# Patient Record
Sex: Female | Born: 1935
Health system: Southern US, Community
[De-identification: ages and names within clinical notes are randomized; demographics above are authoritative.]

## PROBLEM LIST (undated history)

## (undated) DIAGNOSIS — K279 Peptic ulcer, site unspecified, unspecified as acute or chronic, without hemorrhage or perforation: Secondary | ICD-10-CM

## (undated) DIAGNOSIS — I272 Pulmonary hypertension, unspecified: Secondary | ICD-10-CM

## (undated) DIAGNOSIS — F329 Major depressive disorder, single episode, unspecified: Secondary | ICD-10-CM

## (undated) DIAGNOSIS — E039 Hypothyroidism, unspecified: Secondary | ICD-10-CM

## (undated) DIAGNOSIS — D649 Anemia, unspecified: Secondary | ICD-10-CM

## (undated) DIAGNOSIS — I482 Chronic atrial fibrillation, unspecified: Secondary | ICD-10-CM

## (undated) DIAGNOSIS — M1990 Unspecified osteoarthritis, unspecified site: Secondary | ICD-10-CM

## (undated) DIAGNOSIS — S065X9A Traumatic subdural hemorrhage with loss of consciousness of unspecified duration, initial encounter: Secondary | ICD-10-CM

## (undated) DIAGNOSIS — F32A Depression, unspecified: Secondary | ICD-10-CM

## (undated) DIAGNOSIS — R296 Repeated falls: Secondary | ICD-10-CM

## (undated) DIAGNOSIS — I5032 Chronic diastolic (congestive) heart failure: Secondary | ICD-10-CM

## (undated) DIAGNOSIS — F419 Anxiety disorder, unspecified: Secondary | ICD-10-CM

## (undated) DIAGNOSIS — N184 Chronic kidney disease, stage 4 (severe): Secondary | ICD-10-CM

## (undated) DIAGNOSIS — I10 Essential (primary) hypertension: Secondary | ICD-10-CM

## (undated) DIAGNOSIS — W19XXXA Unspecified fall, initial encounter: Secondary | ICD-10-CM

## (undated) DIAGNOSIS — R9431 Abnormal electrocardiogram [ECG] [EKG]: Secondary | ICD-10-CM

## (undated) DIAGNOSIS — M10372 Gout due to renal impairment, left ankle and foot: Secondary | ICD-10-CM

## (undated) DIAGNOSIS — S065XAA Traumatic subdural hemorrhage with loss of consciousness status unknown, initial encounter: Secondary | ICD-10-CM

## (undated) HISTORY — PX: TUBAL LIGATION: SHX77

## (undated) HISTORY — PX: ABDOMINAL HYSTERECTOMY: SUR658

---

## 1997-12-04 ENCOUNTER — Encounter: Payer: Self-pay | Admitting: Emergency Medicine

## 1997-12-04 ENCOUNTER — Emergency Department (HOSPITAL_COMMUNITY): Admission: EM | Admit: 1997-12-04 | Discharge: 1997-12-04 | Payer: Self-pay | Admitting: Emergency Medicine

## 2004-06-14 ENCOUNTER — Encounter: Admission: RE | Admit: 2004-06-14 | Discharge: 2004-06-14 | Payer: Self-pay | Admitting: Specialist

## 2005-10-17 ENCOUNTER — Emergency Department (HOSPITAL_COMMUNITY): Admission: EM | Admit: 2005-10-17 | Discharge: 2005-10-18 | Payer: Self-pay | Admitting: Emergency Medicine

## 2005-10-25 ENCOUNTER — Encounter: Admission: RE | Admit: 2005-10-25 | Discharge: 2005-10-25 | Payer: Self-pay | Admitting: Specialist

## 2007-06-11 ENCOUNTER — Inpatient Hospital Stay (HOSPITAL_COMMUNITY): Admission: EM | Admit: 2007-06-11 | Discharge: 2007-06-12 | Payer: Self-pay | Admitting: Emergency Medicine

## 2007-07-31 ENCOUNTER — Ambulatory Visit (HOSPITAL_COMMUNITY): Admission: RE | Admit: 2007-07-31 | Discharge: 2007-07-31 | Payer: Self-pay | Admitting: Neurosurgery

## 2008-01-30 ENCOUNTER — Ambulatory Visit (HOSPITAL_COMMUNITY): Admission: RE | Admit: 2008-01-30 | Discharge: 2008-01-30 | Payer: Self-pay | Admitting: Neurosurgery

## 2010-05-10 ENCOUNTER — Encounter: Payer: Self-pay | Admitting: Neurosurgery

## 2010-09-01 NOTE — Discharge Summary (Signed)
Taylor Coleman, Taylor Coleman                ACCOUNT NO.:  [id number]   MEDICAL RECORD NO.:  [id number]          PATIENT TYPE:  INP   LOCATION:  3106                         FACILITY:  [facility]   PHYSICIAN:  Clydene Fake, M.D.  DATE OF BIRTH:  01-08-1929   DATE OF ADMISSION:  06/10/2007  DATE OF DISCHARGE:  06/12/2007                               DISCHARGE SUMMARY   DIAGNOSES:  Closed head injury, headache, mass extra axial front of her  brain stem.   DISCHARGE DIAGNOSES:  Closed head injury, headache, mass extra axial  front of her brain stem.   REASON FOR ADMISSION:  The patient is a 77 year old woman who fell down  the last stairs on a flight of stairs hitting the back of her head  having a contusion there on the skull.  She had no loss of  consciousness, no nausea or vomiting, had a growing knot in the right  occiput and presented to the emergency room.  A CT and MR were done  showing a hyperdense mass in the front of the brain stem at the  vertebrobasilar junction.  MRI showed it was hyperintense at T1,  hypointense in T2.  MRA was negative.  There was thought to be the  possibility of a blood clot versus a benign mass, something like a  dermoid.  The patient was watched in the ICU for 2 nights, most of the  night on Saturday and then overnight Sunday.  She has remained  neurologically intact.  Her headache has dissipated.  She has been  eating well, up ambulating, no major complaints.  Blood pressure has  been controlled.  A repeat CT shows lesion is unchanged and no  enhancement with contrast and CT angiogram shows no aneurysm, no problem  with the blood vessels in the vertebrobasilar area or anywhere else.  The patient will be discharged home in stable condition.   DISCHARGE MEDICATIONS:  Same as pre-hospitalization.   FOLLOWUP:  MRI with and without contrast in 3-4 weeks from now with  followup in my office in 4-5 weeks from now to evaluate this further.     ______________________________  Clydene Fake, M.D.     JRH/MEDQ  D:  06/12/2007  T:  06/12/2007  Job:  [id number]

## 2011-01-11 LAB — I-STAT 8, (EC8 V) (CONVERTED LAB)
BUN: 15
Bicarbonate: 23.9
Glucose, Bld: 144 — ABNORMAL HIGH
Hemoglobin: 15
Sodium: 137
TCO2: 25
pH, Ven: 7.386 — ABNORMAL HIGH

## 2011-01-11 LAB — CBC
HCT: 39.7
Hemoglobin: 13.6
MCV: 90.1
RDW: 13.2
WBC: 7.7

## 2011-01-11 LAB — BASIC METABOLIC PANEL
BUN: 13
Chloride: 100
GFR calc non Af Amer: >60
Glucose, Bld: 130 — ABNORMAL HIGH
Potassium: 4.5
Sodium: 137

## 2011-01-11 LAB — APTT: aPTT: 34

## 2011-01-11 LAB — POCT I-STAT CREATININE: Operator id: 265201

## 2011-01-18 LAB — BUN: BUN: 19

## 2011-01-18 LAB — CREATININE, SERUM
Creatinine, Ser: 1.07
GFR calc non Af Amer: 50 — ABNORMAL LOW

## 2011-04-04 ENCOUNTER — Inpatient Hospital Stay (HOSPITAL_COMMUNITY)
Admission: EM | Admit: 2011-04-04 | Discharge: 2011-04-09 | DRG: 086 | Disposition: A | Discharge status: Home / Self Care | Payer: Medicare Other | Attending: Neurosurgery | Admitting: Neurosurgery

## 2011-04-04 ENCOUNTER — Emergency Department (HOSPITAL_COMMUNITY): Payer: Medicare Other

## 2011-04-04 ENCOUNTER — Other Ambulatory Visit: Payer: Self-pay

## 2011-04-04 DIAGNOSIS — S0100XA Unspecified open wound of scalp, initial encounter: Secondary | ICD-10-CM | POA: Diagnosis present

## 2011-04-04 DIAGNOSIS — W19XXXA Unspecified fall, initial encounter: Secondary | ICD-10-CM

## 2011-04-04 DIAGNOSIS — Y998 Other external cause status: Secondary | ICD-10-CM

## 2011-04-04 DIAGNOSIS — S0101XA Laceration without foreign body of scalp, initial encounter: Secondary | ICD-10-CM

## 2011-04-04 DIAGNOSIS — W108XXA Fall (on) (from) other stairs and steps, initial encounter: Secondary | ICD-10-CM | POA: Diagnosis present

## 2011-04-04 DIAGNOSIS — A498 Other bacterial infections of unspecified site: Secondary | ICD-10-CM | POA: Diagnosis present

## 2011-04-04 DIAGNOSIS — N390 Urinary tract infection, site not specified: Secondary | ICD-10-CM

## 2011-04-04 DIAGNOSIS — Y9229 Other specified public building as the place of occurrence of the external cause: Secondary | ICD-10-CM

## 2011-04-04 DIAGNOSIS — B9620 Unspecified Escherichia coli [E. coli] as the cause of diseases classified elsewhere: Secondary | ICD-10-CM | POA: Diagnosis present

## 2011-04-04 DIAGNOSIS — S065X0A Traumatic subdural hemorrhage without loss of consciousness, initial encounter: Principal | ICD-10-CM | POA: Diagnosis present

## 2011-04-04 DIAGNOSIS — I10 Essential (primary) hypertension: Secondary | ICD-10-CM | POA: Diagnosis present

## 2011-04-04 DIAGNOSIS — S065XAA Traumatic subdural hemorrhage with loss of consciousness status unknown, initial encounter: Secondary | ICD-10-CM | POA: Diagnosis present

## 2011-04-04 DIAGNOSIS — S065X9A Traumatic subdural hemorrhage with loss of consciousness of unspecified duration, initial encounter: Secondary | ICD-10-CM

## 2011-04-04 HISTORY — DX: Essential (primary) hypertension: I10

## 2011-04-04 LAB — CARDIAC PANEL(CRET KIN+CKTOT+MB+TROPI)
CK, MB: 3.5 ng/mL (ref 0.3–4.0)
Relative Index: 2.6 — ABNORMAL HIGH (ref 0.0–2.5)
Troponin I: <0.3 ng/mL (ref <0.30)

## 2011-04-04 LAB — CBC
HCT: 39.9 % (ref 36.0–46.0)
MCH: 30.8 pg (ref 26.0–34.0)
MCV: 90.3 fL (ref 78.0–100.0)
Platelets: 112 10*3/uL — ABNORMAL LOW (ref 150–400)
RDW: 13 % (ref 11.5–15.5)
WBC: 7.1 10*3/uL (ref 4.0–10.5)

## 2011-04-04 LAB — PROTIME-INR: INR: 1.1 (ref 0.00–1.49)

## 2011-04-04 LAB — DIFFERENTIAL
Basophils Absolute: 0 10*3/uL (ref 0.0–0.1)
Eosinophils Absolute: 0.1 10*3/uL (ref 0.0–0.7)
Eosinophils Relative: 1 % (ref 0–5)
Lymphocytes Relative: 29 % (ref 12–46)
Lymphs Abs: 2.1 10*3/uL (ref 0.7–4.0)
Monocytes Absolute: 0.5 10*3/uL (ref 0.1–1.0)

## 2011-04-04 LAB — COMPREHENSIVE METABOLIC PANEL
CO2: 26 mEq/L (ref 19–32)
Calcium: 10 mg/dL (ref 8.4–10.5)
Creatinine, Ser: 1.58 mg/dL — ABNORMAL HIGH (ref 0.50–1.10)
GFR calc Af Amer: 35 mL/min — ABNORMAL LOW (ref >90)
GFR calc non Af Amer: 30 mL/min — ABNORMAL LOW (ref >90)
Glucose, Bld: 149 mg/dL — ABNORMAL HIGH (ref 70–99)
Sodium: 139 mEq/L (ref 135–145)
Total Protein: 7.3 g/dL (ref 6.0–8.3)

## 2011-04-04 LAB — URINE MICROSCOPIC-ADD ON

## 2011-04-04 LAB — URINALYSIS, ROUTINE W REFLEX MICROSCOPIC
Bilirubin Urine: NEGATIVE
Ketones, ur: NEGATIVE mg/dL
Nitrite: POSITIVE — AB
Protein, ur: NEGATIVE mg/dL
Urobilinogen, UA: 0.2 mg/dL (ref 0.0–1.0)

## 2011-04-04 MED ORDER — TRAMADOL HCL 50 MG PO TABS
50.0000 mg | ORAL_TABLET | Freq: Four times a day (QID) | ORAL | Status: DC | PRN
  Filled 2011-04-04: qty 2

## 2011-04-04 MED ORDER — PANTOPRAZOLE SODIUM 40 MG IV SOLR
40.0000 mg | Freq: Every day | INTRAVENOUS | Status: DC
  Administered 2011-04-04 – 2011-04-05 (×2): 40 mg via INTRAVENOUS
  Filled 2011-04-04 (×3): qty 40

## 2011-04-04 MED ORDER — TETANUS-DIPHTHERIA TOXOIDS TD 5-2 LFU IM INJ
0.5000 mL | INJECTION | Freq: Once | INTRAMUSCULAR | Status: AC
  Administered 2011-04-04: 0.5 mL via INTRAMUSCULAR
  Filled 2011-04-04: qty 0.5

## 2011-04-04 MED ORDER — LIDOCAINE HCL (PF) 1 % IJ SOLN
5.0000 mL | Freq: Once | INTRAMUSCULAR | Status: AC
  Administered 2011-04-04: 5 mL

## 2011-04-04 MED ORDER — FAMOTIDINE 20 MG PO TABS
20.0000 mg | ORAL_TABLET | Freq: Every day | ORAL | Status: DC
  Administered 2011-04-05 – 2011-04-09 (×5): 20 mg via ORAL
  Filled 2011-04-04 (×5): qty 1

## 2011-04-04 MED ORDER — ENALAPRIL MALEATE 20 MG PO TABS
20.0000 mg | ORAL_TABLET | Freq: Every day | ORAL | Status: DC
  Administered 2011-04-05 – 2011-04-09 (×5): 20 mg via ORAL
  Filled 2011-04-04 (×5): qty 1

## 2011-04-04 MED ORDER — NITROGLYCERIN 0.4 MG SL SUBL: 0.4000 mg | SUBLINGUAL_TABLET | SUBLINGUAL | Status: DC | PRN

## 2011-04-04 MED ORDER — SODIUM CHLORIDE 0.9 % IV SOLN
INTRAVENOUS | Status: DC
  Administered 2011-04-04: 19:00:00 via INTRAVENOUS

## 2011-04-04 MED ORDER — LEVOFLOXACIN IN D5W 750 MG/150ML IV SOLN
750.0000 mg | INTRAVENOUS | Status: AC
  Administered 2011-04-04: 750 mg via INTRAVENOUS
  Filled 2011-04-04: qty 150

## 2011-04-04 MED ORDER — LEVOFLOXACIN IN D5W 750 MG/150ML IV SOLN: 750.0000 mg | INTRAVENOUS | Status: DC

## 2011-04-04 MED ORDER — ACETAMINOPHEN 325 MG PO TABS
650.0000 mg | ORAL_TABLET | ORAL | Status: DC | PRN
  Filled 2011-04-04: qty 2

## 2011-04-04 MED ORDER — HYDROMORPHONE HCL PF 1 MG/ML IJ SOLN
0.5000 mg | INTRAMUSCULAR | Status: DC | PRN
  Administered 2011-04-04 – 2011-04-05 (×4): 0.5 mg via INTRAVENOUS
  Filled 2011-04-04 (×3): qty 1

## 2011-04-04 MED ORDER — FUROSEMIDE 40 MG PO TABS
40.0000 mg | ORAL_TABLET | Freq: Two times a day (BID) | ORAL | Status: DC
  Administered 2011-04-05 – 2011-04-09 (×8): 40 mg via ORAL
  Filled 2011-04-04 (×12): qty 1

## 2011-04-04 MED ORDER — SENNOSIDES-DOCUSATE SODIUM 8.6-50 MG PO TABS
1.0000 | ORAL_TABLET | Freq: Two times a day (BID) | ORAL | Status: DC
  Administered 2011-04-04 – 2011-04-09 (×9): 1 via ORAL
  Filled 2011-04-04 (×10): qty 1

## 2011-04-04 MED ORDER — ACETAMINOPHEN 650 MG RE SUPP
650.0000 mg | RECTAL | Status: DC | PRN
  Administered 2011-04-07: 650 mg via RECTAL
  Filled 2011-04-04: qty 1

## 2011-04-04 MED ORDER — LABETALOL HCL 5 MG/ML IV SOLN
10.0000 mg | INTRAVENOUS | Status: DC | PRN
  Filled 2011-04-04: qty 8
  Filled 2011-04-04: qty 4

## 2011-04-04 MED ORDER — ONDANSETRON HCL 4 MG/2ML IJ SOLN
4.0000 mg | Freq: Four times a day (QID) | INTRAMUSCULAR | Status: DC | PRN
  Administered 2011-04-04 – 2011-04-05 (×2): 4 mg via INTRAVENOUS
  Filled 2011-04-04 (×2): qty 2

## 2011-04-04 MED ORDER — METOPROLOL TARTRATE 50 MG PO TABS
50.0000 mg | ORAL_TABLET | Freq: Two times a day (BID) | ORAL | Status: DC
  Administered 2011-04-05 – 2011-04-09 (×9): 50 mg via ORAL
  Filled 2011-04-04 (×11): qty 1

## 2011-04-04 NOTE — ED Provider Notes (Signed)
History     CSN: [id number] Arrival date & time: 04/04/2011  3:57 PM   First MD Initiated Contact with Patient 04/04/11 1604      Chief Complaint  Patient presents with  . Fall  . Head Injury  . Head Laceration    HPI  82yoF h/o HTN presents after fall. Pt states "I don't really know why I fell". Family thinks she lost her balance. Fell backward onto escalator. No LOC per family. Pt states "Im not sure what happened". Fully immobilized pta. Pt c/o headache. Bleeding from scalp laceration per family. Denies headache, dizziness, cp, palpitations, shortness of breath pre fall and currently. No neck pain or back pain. Denies hip pain. Has not attempted ambulation since event. Unsure if she's taking anticoagulants.   ED Notes, ED Provider Notes from 04/04/11 0000 to 04/04/11 16:01:07       Katie Army Melia, RN 04/04/2011 15:58      [place] escalator when she lost her balance and fell. She fell backwards hitting her head. She has a laceration to the right posterior surface of her head. Bleeding controlled at the scene. Pt is alert and oriented. Pt reports no loc. Pt fully immobilized pta.     Past Medical History  Diagnosis Date  . Hypertension     Past Surgical History  Procedure Date  . Tubal ligation     History reviewed. No pertinent family history.  History  Substance Use Topics  . Smoking status: Former Games developer  . Smokeless tobacco: Current User  . Alcohol Use: No    OB History    Grav Para Term Preterm Abortions TAB SAB Ect Mult Living                  Review of Systems  All other systems reviewed and are negative.   except as noted HPI  Allergies  Penicillins  Home Medications   Current Outpatient Rx  Name Route Sig Dispense Refill  . ENALAPRIL MALEATE 20 MG PO TABS Oral Take 20 mg by mouth daily.      . FUROSEMIDE 40 MG PO TABS Oral Take 40 mg by mouth 2 (two) times daily.      Marland Kitchen METOPROLOL TARTRATE 50 MG PO TABS Oral  Take 50 mg by mouth 2 (two) times daily.      Marland Kitchen NITROGLYCERIN 0.4 MG SL SUBL Sublingual Place 0.4 mg under the tongue every 5 (five) minutes as needed. For chest pains     . RANITIDINE HCL 150 MG PO TABS Oral Take 150 mg by mouth 2 (two) times daily.        BP 157/69  Pulse 83  Temp(Src) 98 F (36.7 C) (Oral)  Resp 12  SpO2 99%  Physical Exam  Nursing note and vitals reviewed. Constitutional: She is oriented to person, place, and time. She appears well-developed.  HENT:  Mouth/Throat: Oropharynx is clear and moist.       No septal hematoma No facial ttp  Eyes: Conjunctivae and EOM are normal. Pupils are equal, round, and reactive to light.  Neck: Normal range of motion. Neck supple.  Cardiovascular: Normal rate, normal heart sounds and intact distal pulses.   Pulmonary/Chest: Effort normal and breath sounds normal. No respiratory distress. She has no wheezes. She has no rales.  Abdominal: Soft. She exhibits no distension. There is tenderness. There is no rebound and no guarding.       Mild upper abd ttp  No r/g No pulsatile mass  Musculoskeletal: Normal range of motion.       No pain in hips with int/ext rotation b/l LE  Neurological: She is alert and oriented to person, place, and time. No cranial nerve deficit. She exhibits abnormal muscle tone. Coordination normal.  Skin: Skin is warm and dry. No rash noted.       Posterior scalp 3cm bleeding laceration  Psychiatric: She has a normal mood and affect.     Date: 04/04/2011  Rate: 81  Rhythm: atrial fibrillation  QRS Axis: left  Intervals: indeterm  ST/T Wave abnormalities: normal  Conduction Disutrbances:none  Narrative Interpretation: afib, probably inferiro infarct, age indet, anterior infarct old  Old EKG Reviewed: none available   ED Course  LACERATION REPAIR Date/Time: 04/04/2011 4:28 PM Performed by: Forbes Cellar Authorized by: Forbes Cellar Consent: Verbal consent obtained. Consent given by:  patient Patient understanding: patient states understanding of the procedure being performed Patient consent: the patient's understanding of the procedure matches consent given Patient identity confirmed: verbally with patient Time out: Immediately prior to procedure a "time out" was called to verify the correct patient, procedure, equipment, support staff and site/side marked as required. Body area: head/neck Location details: scalp Laceration length: 2 cm Foreign bodies: no foreign bodies Tendon involvement: none Nerve involvement: none Vascular damage: no Anesthesia: local infiltration Local anesthetic: lidocaine 1% without epinephrine Anesthetic total: 2 ml Patient sedated: no Irrigation solution: saline Amount of cleaning: standard Debridement: none Degree of undermining: none Skin closure: staples Number of sutures: 4 Dressing: pressure dressing Patient tolerance: Patient tolerated the procedure well with no immediate complications.   (including critical care time)  Labs Reviewed  CBC - Abnormal; Notable for the following:    Platelets 112 (*) PLATELET COUNT CONFIRMED BY SMEAR   All other components within normal limits  COMPREHENSIVE METABOLIC PANEL - Abnormal; Notable for the following:    Glucose, Bld 149 (*)    BUN 28 (*)    Creatinine, Ser 1.58 (*)    GFR calc non Af Amer 30 (*)    GFR calc Af Amer 35 (*)    All other components within normal limits  CARDIAC PANEL(CRET KIN+CKTOT+MB+TROPI) - Abnormal; Notable for the following:    Relative Index 2.6 (*)    All other components within normal limits  URINALYSIS, ROUTINE W REFLEX MICROSCOPIC - Abnormal; Notable for the following:    Hgb urine dipstick TRACE (*)    Nitrite POSITIVE (*)    Leukocytes, UA TRACE (*)    All other components within normal limits  URINE MICROSCOPIC-ADD ON - Abnormal; Notable for the following:    Bacteria, UA MANY (*)    All other components within normal limits  DIFFERENTIAL   PROTIME-INR  URINE CULTURE   Dg Chest 1 View  04/04/2011  *RADIOLOGY REPORT*  Clinical Data: Fall  CHEST - 1 VIEW  Comparison: None.  Findings: Lungs are essentially clear. No pleural effusion or pneumothorax.  The heart is top normal in size.  IMPRESSION: No evidence of acute cardiopulmonary disease.  Original Report Authenticated By: Charline Bills, M.D.   Dg Thoracic Spine 2 View  04/04/2011  *RADIOLOGY REPORT*  Clinical Data: Fall.  THORACIC SPINE - 2 VIEW  Comparison: None  Findings:  Bones are diffusely osteopenic.  There is no evidence of lumbar spine fracture.  Alignment is normal.  Intervertebral disc spaces are maintained.  IMPRESSION: 1.  Diminished detail due to diffuse osteopenia. 2.  No acute findings.  Original Report Authenticated By: Rosealee Albee, M.D.   Ct Head Wo Contrast  04/04/2011  *RADIOLOGY REPORT*  Clinical Data:  Fall, head laceration  CT HEAD WITHOUT CONTRAST CT CERVICAL SPINE WITHOUT CONTRAST  Technique:  Multidetector CT imaging of the head and cervical spine was performed following the standard protocol without intravenous contrast.  Multiplanar CT image reconstructions of the cervical spine were also generated.  Comparison:  None  CT HEAD  Findings: Left frontal subdural hematoma measuring 6 mm in maximal thickness (series 2/image 20). No evidence of parenchymal hemorrhage.  No mass effect, mass lesion, or midline shift.  No CT evidence of acute infarction.  Extensive subcortical white matter and periventricular small vessel ischemic changes.  Intracranial atherosclerosis.  Age related atrophy.  No ventriculomegaly.  The visualized paranasal sinuses are essentially clear. The mastoid air cells are unopacified.  Soft tissue swelling/laceration overlying the right parietal scalp.  No evidence of calvarial fracture.  IMPRESSION: Left frontal subdural hematoma measuring 6 mm in maximal thickness. No midline shift.  Soft tissue swelling/laceration overlying the right  parietal scalp. No evidence of calvarial fracture.  Atrophy with small vessel ischemic changes and intracranial atherosclerosis.  Critical Value/emergent results were called by telephone at the time of interpretation on 04/04/2011  at 1720 hrs  to  Dr Hyman Hopes in the ED, who verbally acknowledged these results.  CT CERVICAL SPINE  Findings: Normal cervical lordosis.  No evidence of fracture dislocation.  Vertebral body heights are maintained.  The dens appears intact.  No prevertebral soft tissue swelling.  Moderate multilevel degenerative changes.  Partially calcified pannus at C1-2.  Visualized thyroid is unremarkable.  Visualized lung apices are grossly clear.  IMPRESSION: No evidence of traumatic injury to the cervical spine.  Moderate multilevel degenerative changes.  Original Report Authenticated By: Charline Bills, M.D.   Ct Cervical Spine Wo Contrast  04/04/2011  *RADIOLOGY REPORT*  Clinical Data:  Fall, head laceration  CT HEAD WITHOUT CONTRAST CT CERVICAL SPINE WITHOUT CONTRAST  Technique:  Multidetector CT imaging of the head and cervical spine was performed following the standard protocol without intravenous contrast.  Multiplanar CT image reconstructions of the cervical spine were also generated.  Comparison:  None  CT HEAD  Findings: Left frontal subdural hematoma measuring 6 mm in maximal thickness (series 2/image 20). No evidence of parenchymal hemorrhage.  No mass effect, mass lesion, or midline shift.  No CT evidence of acute infarction.  Extensive subcortical white matter and periventricular small vessel ischemic changes.  Intracranial atherosclerosis.  Age related atrophy.  No ventriculomegaly.  The visualized paranasal sinuses are essentially clear. The mastoid air cells are unopacified.  Soft tissue swelling/laceration overlying the right parietal scalp.  No evidence of calvarial fracture.  IMPRESSION: Left frontal subdural hematoma measuring 6 mm in maximal thickness. No midline shift.  Soft  tissue swelling/laceration overlying the right parietal scalp. No evidence of calvarial fracture.  Atrophy with small vessel ischemic changes and intracranial atherosclerosis.  Critical Value/emergent results were called by telephone at the time of interpretation on 04/04/2011  at 1720 hrs  to  Dr Hyman Hopes in the ED, who verbally acknowledged these results.  CT CERVICAL SPINE  Findings: Normal cervical lordosis.  No evidence of fracture dislocation.  Vertebral body heights are maintained.  The dens appears intact.  No prevertebral soft tissue swelling.  Moderate multilevel degenerative changes.  Partially calcified pannus at C1-2.  Visualized thyroid is unremarkable.  Visualized lung apices are grossly clear.  IMPRESSION: No evidence of  traumatic injury to the cervical spine.  Moderate multilevel degenerative changes.  Original Report Authenticated By: Charline Bills, M.D.     1. Subdural hematoma   2. Fall   3. Scalp laceration   4. UTI (lower urinary tract infection)      MDM  Fall unk cause. Possible mechanical but patient not able to give accurate history. Will check CT head, c spine, thoracic XR, labs, tetanus, reassess. Anticipate lac repair   1740  Discussed Subdural hematoma with radiology. DW Neurosurgery who will see at bedside.  UTI noted. Levaquin ordered  6:30 PM Dr. Jordan Likes, neurosurgery to admit       Forbes Cellar, MD 04/04/11 [phone]

## 2011-04-04 NOTE — ED Notes (Signed)
Patient transported to 3100 on portable cardiac monitor with RN and nurse tech.

## 2011-04-04 NOTE — H&P (Signed)
Taylor Coleman is an 77 y.o. female.   Chief Complaint: fall   HPI: 77 yo female s/p fall backward on escalator. No LOC. Mild HA.  Mild upper thoracic pain.  No numbness, parathesias, or weakness. No Seizure.  Denies syncope.  Past Medical History  Diagnosis Date  . Hypertension     Past Surgical History  Procedure Date  . Tubal ligation     History reviewed. No pertinent family history. Social History:  reports that she has quit smoking. She uses smokeless tobacco. She reports that she does not drink alcohol or use illicit drugs.  Allergies:  Allergies  Allergen Reactions  . Penicillins Other (See Comments)    unknown    Medications Prior to Admission  Medication Dose Route Frequency Provider Last Rate Last Dose  . Levofloxacin (LEVAQUIN) IVPB 750 mg  750 mg Intravenous Q24H Forbes Cellar, MD      . lidocaine (XYLOCAINE) 1 % injection 5 mL  5 mL Infiltration Once Forbes Cellar, MD      . tetanus & diphtheria toxoids (adult) [facility]) injection 0.5 mL  0.5 mL Intramuscular Once Forbes Cellar, MD   0.5 mL at 04/04/11 1633   No current outpatient prescriptions on file as of 04/04/2011.    Results for orders placed during the hospital encounter of 04/04/11 (from the past 48 hour(s))  CBC     Status: Abnormal   Collection Time   04/04/11  4:19 PM      Component Value Range Comment   WBC 7.1  4.0 - 10.5 (K/uL)    RBC 4.42  3.87 - 5.11 (MIL/uL)    Hemoglobin 13.6  12.0 - 15.0 (g/dL)    HCT 16.1  09.6 - 04.5 (%)    MCV 90.3  78.0 - 100.0 (fL)    MCH 30.8  26.0 - 34.0 (pg)    MCHC 34.1  30.0 - 36.0 (g/dL)    RDW 40.9  81.1 - 91.4 (%)    Platelets 112 (*) 150 - 400 (K/uL) PLATELET COUNT CONFIRMED BY SMEAR  DIFFERENTIAL     Status: Normal   Collection Time   04/04/11  4:19 PM      Component Value Range Comment   Neutrophils Relative 63  43 - 77 (%)    Neutro Abs 4.5  1.7 - 7.7 (K/uL)    Lymphocytes Relative 29  12 - 46 (%)    Lymphs Abs 2.1  0.7 - 4.0 (K/uL)    Monocytes Relative 7  3 - 12 (%)    Monocytes Absolute 0.5  0.1 - 1.0 (K/uL)    Eosinophils Relative 1  0 - 5 (%)    Eosinophils Absolute 0.1  0.0 - 0.7 (K/uL)    Basophils Relative 0  0 - 1 (%)    Basophils Absolute 0.0  0.0 - 0.1 (K/uL)   COMPREHENSIVE METABOLIC PANEL     Status: Abnormal   Collection Time   04/04/11  4:19 PM      Component Value Range Comment   Sodium 139  135 - 145 (mEq/L)    Potassium 4.4  3.5 - 5.1 (mEq/L)    Chloride 102  96 - 112 (mEq/L)    CO2 26  19 - 32 (mEq/L)    Glucose, Bld 149 (*) 70 - 99 (mg/dL)    BUN 28 (*) 6 - 23 (mg/dL)    Creatinine, Ser 7.82 (*) 0.50 - 1.10 (mg/dL)    Calcium 95.6  8.4 - 10.5 (mg/dL)  Total Protein 7.3  6.0 - 8.3 (g/dL)    Albumin 3.9  3.5 - 5.2 (g/dL)    AST 26  0 - 37 (U/L)    ALT 11  0 - 35 (U/L)    Alkaline Phosphatase 71  39 - 117 (U/L)    Total Bilirubin 0.6  0.3 - 1.2 (mg/dL)    GFR calc non Af Amer 30 (*) >90 (mL/min)    GFR calc Af Amer 35 (*) >90 (mL/min)   CARDIAC PANEL(CRET KIN+CKTOT+MB+TROPI)     Status: Abnormal   Collection Time   04/04/11  4:19 PM      Component Value Range Comment   Total CK 135  7 - 177 (U/L)    CK, MB 3.5  0.3 - 4.0 (ng/mL)    Troponin I <0.30  <0.30 (ng/mL)    Relative Index 2.6 (*) 0.0 - 2.5    PROTIME-INR     Status: Normal   Collection Time   04/04/11  4:19 PM      Component Value Range Comment   Prothrombin Time 14.4  11.6 - 15.2 (seconds)    INR 1.10  0.00 - 1.49    URINALYSIS, ROUTINE W REFLEX MICROSCOPIC     Status: Abnormal   Collection Time   04/04/11  5:12 PM      Component Value Range Comment   Color, Urine YELLOW  YELLOW     APPearance CLEAR  CLEAR     Specific Gravity, Urine 1.008  1.005 - 1.030     pH 6.0  5.0 - 8.0     Glucose, UA NEGATIVE  NEGATIVE (mg/dL)    Hgb urine dipstick TRACE (*) NEGATIVE     Bilirubin Urine NEGATIVE  NEGATIVE     Ketones, ur NEGATIVE  NEGATIVE (mg/dL)    Protein, ur NEGATIVE  NEGATIVE (mg/dL)    Urobilinogen, UA 0.2  0.0 - 1.0  (mg/dL)    Nitrite POSITIVE (*) NEGATIVE     Leukocytes, UA TRACE (*) NEGATIVE    URINE MICROSCOPIC-ADD ON     Status: Abnormal   Collection Time   04/04/11  5:12 PM      Component Value Range Comment   WBC, UA 0-2  <3 (WBC/hpf)    RBC / HPF 0-2  <3 (RBC/hpf)    Bacteria, UA MANY (*) RARE     Ct Head Wo Contrast  04/04/2011  *RADIOLOGY REPORT*  Clinical Data:  Fall, head laceration  CT HEAD WITHOUT CONTRAST CT CERVICAL SPINE WITHOUT CONTRAST  Technique:  Multidetector CT imaging of the head and cervical spine was performed following the standard protocol without intravenous contrast.  Multiplanar CT image reconstructions of the cervical spine were also generated.  Comparison:  None  CT HEAD  Findings: Left frontal subdural hematoma measuring 6 mm in maximal thickness (series 2/image 20). No evidence of parenchymal hemorrhage.  No mass effect, mass lesion, or midline shift.  No CT evidence of acute infarction.  Extensive subcortical white matter and periventricular small vessel ischemic changes.  Intracranial atherosclerosis.  Age related atrophy.  No ventriculomegaly.  The visualized paranasal sinuses are essentially clear. The mastoid air cells are unopacified.  Soft tissue swelling/laceration overlying the right parietal scalp.  No evidence of calvarial fracture.  IMPRESSION: Left frontal subdural hematoma measuring 6 mm in maximal thickness. No midline shift.  Soft tissue swelling/laceration overlying the right parietal scalp. No evidence of calvarial fracture.  Atrophy with small vessel ischemic changes and intracranial atherosclerosis.  Critical  Value/emergent results were called by telephone at the time of interpretation on 04/04/2011  at 1720 hrs  to  Dr Hyman Hopes in the ED, who verbally acknowledged these results.  CT CERVICAL SPINE  Findings: Normal cervical lordosis.  No evidence of fracture dislocation.  Vertebral body heights are maintained.  The dens appears intact.  No prevertebral soft tissue  swelling.  Moderate multilevel degenerative changes.  Partially calcified pannus at C1-2.  Visualized thyroid is unremarkable.  Visualized lung apices are grossly clear.  IMPRESSION: No evidence of traumatic injury to the cervical spine.  Moderate multilevel degenerative changes.  Original Report Authenticated By: Charline Bills, M.D.   Ct Cervical Spine Wo Contrast  04/04/2011  *RADIOLOGY REPORT*  Clinical Data:  Fall, head laceration  CT HEAD WITHOUT CONTRAST CT CERVICAL SPINE WITHOUT CONTRAST  Technique:  Multidetector CT imaging of the head and cervical spine was performed following the standard protocol without intravenous contrast.  Multiplanar CT image reconstructions of the cervical spine were also generated.  Comparison:  None  CT HEAD  Findings: Left frontal subdural hematoma measuring 6 mm in maximal thickness (series 2/image 20). No evidence of parenchymal hemorrhage.  No mass effect, mass lesion, or midline shift.  No CT evidence of acute infarction.  Extensive subcortical white matter and periventricular small vessel ischemic changes.  Intracranial atherosclerosis.  Age related atrophy.  No ventriculomegaly.  The visualized paranasal sinuses are essentially clear. The mastoid air cells are unopacified.  Soft tissue swelling/laceration overlying the right parietal scalp.  No evidence of calvarial fracture.  IMPRESSION: Left frontal subdural hematoma measuring 6 mm in maximal thickness. No midline shift.  Soft tissue swelling/laceration overlying the right parietal scalp. No evidence of calvarial fracture.  Atrophy with small vessel ischemic changes and intracranial atherosclerosis.  Critical Value/emergent results were called by telephone at the time of interpretation on 04/04/2011  at 1720 hrs  to  Dr Hyman Hopes in the ED, who verbally acknowledged these results.  CT CERVICAL SPINE  Findings: Normal cervical lordosis.  No evidence of fracture dislocation.  Vertebral body heights are maintained.  The  dens appears intact.  No prevertebral soft tissue swelling.  Moderate multilevel degenerative changes.  Partially calcified pannus at C1-2.  Visualized thyroid is unremarkable.  Visualized lung apices are grossly clear.  IMPRESSION: No evidence of traumatic injury to the cervical spine.  Moderate multilevel degenerative changes.  Original Report Authenticated By: Charline Bills, M.D.    Review of Systems  All other systems reviewed and are negative.    Blood pressure 157/69, pulse 83, temperature 98 F (36.7 C), temperature source Oral, resp. rate 12, SpO2 99.00%. Physical Exam  Nursing note and vitals reviewed. Constitutional: She is oriented to person, place, and time. She appears well-developed and well-nourished. No distress.  HENT:  Head: Normocephalic.  Right Ear: External ear normal.  Left Ear: External ear normal.  Nose: Nose normal.  Mouth/Throat: Oropharynx is clear and moist.       Right parietal occipital contusion with small lac closed by edp.  Eyes: Conjunctivae and EOM are normal. Pupils are equal, round, and reactive to light. Right eye exhibits no discharge. Left eye exhibits discharge.  Neck: Normal range of motion. Neck supple. No tracheal deviation present. No thyromegaly present.  Cardiovascular: Normal rate, regular rhythm, normal heart sounds and intact distal pulses.   Respiratory: Effort normal and breath sounds normal. No respiratory distress. She has no wheezes.  GI: Soft. Bowel sounds are normal. She exhibits no distension. There is  no tenderness.  Musculoskeletal: Normal range of motion. She exhibits no edema and no tenderness.  Neurological: She is alert and oriented to person, place, and time. She has normal strength and normal reflexes. No cranial nerve deficit or sensory deficit. She displays a negative Romberg sign. Coordination normal. GCS eye subscore is 4. GCS verbal subscore is 5. GCS motor subscore is 6.  Skin: Skin is warm and dry. No rash noted.  She is not diaphoretic. No erythema.     Assessment/Plan Small left frontal SDH without mass effect.  No anticoagulants on board.  Admit and observe.  Re-CT in am.    Evona Westra A 04/04/2011, 6:22 PM

## 2011-04-04 NOTE — ED Notes (Signed)
[place] on the escalator when she lost her balance and fell. She fell backwards hitting her head. She has a laceration to the right posterior surface of her head. Bleeding controlled at the scene. Pt is alert and oriented. Pt reports no loc. Pt fully immobilized pta.

## 2011-04-04 NOTE — ED Notes (Signed)
MD at bedside. 

## 2011-04-04 NOTE — ED Notes (Signed)
Received bedside report from [place], [place].  Patient currently resting quietly in bed; no respiratory or acute distress noted.  Family present at bedside.  Updated patient and family on plan of care; informed patient that a bed is available and report is being called; report given to [address] Velna Hatchet, Charity fundraiser).  Informed Velna Hatchet that patient refused the cracker portion of swallow screen because she was sleepy.  Preparing patient for transport.

## 2011-04-05 ENCOUNTER — Inpatient Hospital Stay (HOSPITAL_COMMUNITY): Payer: Medicare Other

## 2011-04-05 ENCOUNTER — Encounter (HOSPITAL_COMMUNITY): Payer: Self-pay | Admitting: Radiology

## 2011-04-05 LAB — CBC
Hemoglobin: 12.6 g/dL (ref 12.0–15.0)
MCH: 30.8 pg (ref 26.0–34.0)
MCHC: 34 g/dL (ref 30.0–36.0)
MCV: 90.7 fL (ref 78.0–100.0)

## 2011-04-05 LAB — BASIC METABOLIC PANEL
Calcium: 9.5 mg/dL (ref 8.4–10.5)
Creatinine, Ser: 1.27 mg/dL — ABNORMAL HIGH (ref 0.50–1.10)
GFR calc non Af Amer: 39 mL/min — ABNORMAL LOW (ref >90)
Glucose, Bld: 152 mg/dL — ABNORMAL HIGH (ref 70–99)
Sodium: 138 mEq/L (ref 135–145)

## 2011-04-05 NOTE — Progress Notes (Signed)
Physical Therapy Evaluation Patient Details Name: Taylor Coleman MRN: [id number] DOB: [birth year] Today's Date: 04/05/2011  Problem List:  Patient Active Problem List  Diagnoses  . Subdural hematoma, post-traumatic    Past Medical History:  Past Medical History  Diagnosis Date  . Hypertension    Past Surgical History:  Past Surgical History  Procedure Date  . Tubal ligation     PT Assessment/Plan/Recommendation PT Assessment Clinical Impression Statement: Patient presents with small SDH as a result of a fall in the community. Recommend PT to ensure patient can safely ambulate and decrease patients risk of fall at discharge. PT Recommendation/Assessment: Patient will need skilled PT in the acute care venue PT Problem List: Decreased activity tolerance;Decreased balance;Decreased mobility;Decreased knowledge of use of DME;Decreased knowledge of precautions PT Therapy Diagnosis : Difficulty walking;Abnormality of gait PT Plan PT Frequency: Min 4X/week PT Treatment/Interventions: DME instruction;Gait training;Stair training;Functional mobility training;Therapeutic activities;Therapeutic exercise;Balance training;Patient/family education PT Recommendation Follow Up Recommendations: Home health PT Equipment Recommended: Gilmer Mor PT Goals  Acute Rehab PT Goals PT Goal Formulation: With patient Time For Goal Achievement: 7 days Pt will go Supine/Side to Sit: Independently PT Goal: Supine/Side to Sit - Progress: Other (comment) Pt will go Sit to Supine/Side: Independently PT Goal: Sit to Supine/Side - Progress: Other (comment) Pt will go Sit to Stand: Independently PT Goal: Sit to Stand - Progress: Other (comment) Pt will go Stand to Sit: Independently PT Goal: Stand to Sit - Progress: Other (comment) Pt will Ambulate: >150 feet;with modified independence;with least restrictive assistive device PT Goal: Ambulate - Progress: Other (comment) Pt will Go Up / Down Stairs: 1-2  stairs;with cane;with supervision PT Goal: Up/Down Stairs - Progress: Other (comment) Additional Goals Additional Goal #1: Patient will score greater than 45 on Berg balance scale to indicate decreased risk of fall PT Goal: Additional Goal #1 - Progress: Other (comment)  PT Evaluation Precautions/Restrictions  Precautions Precautions: Fall Prior Functioning  Home Living Lives With: Spouse Receives Help From: Family Type of Home: House Home Layout: One level Home Access: Stairs to enter Entrance Stairs-Rails: None Entrance Stairs-Number of Steps: 1 Home Adaptive Equipment: None Prior Function Level of Independence: Independent with basic ADLs;Independent with homemaking with ambulation Driving: No Vocation: Retired Producer, television/film/video: Awake/alert Overall Cognitive Status: Appears within functional limits for tasks assessed Orientation Level: Oriented X4 Sensation/Coordination Sensation Light Touch: Appears Intact Proprioception: Appears Intact Coordination Gross Motor Movements are Fluid and Coordinated: Yes Fine Motor Movements are Fluid and Coordinated: Yes Extremity Assessment RLE Assessment RLE Assessment: Within Functional Limits LLE Assessment LLE Assessment: Within Functional Limits Mobility (including Balance) Transfers Sit to Stand: From chair/3-in-1;With upper extremity assist;With armrests;5: Supervision Sit to Stand Details (indicate cue type and reason): Increased time to achieve stand Stand to Sit: 5: Supervision;With upper extremity assist;To chair/3-in-1 Stand to Sit Details: Controls descent with upper extremities Ambulation/Gait Ambulation/Gait: Yes Ambulation/Gait Assistance: 4: Min assist Ambulation/Gait Assistance Details (indicate cue type and reason): Min-guard assistance. Patient with left hip internal rotation greater than right with heel strike to foot flat (patient states sha is pigeon toed). Patient also, as a result, has  narrow base of support leading to imbalance with gait. She frequently deviatesf rom path and holds on to anything in the hall way that she can. If we move to the center of the halls away from contact she will hold to PT. Ambulation Distance (Feet): 170 Feet Assistive device: None  Static Standing Balance Static Standing - Balance Support: No upper extremity supported Static Standing -  Level of Assistance: 5: Stand by assistance Dynamic Standing Balance Dynamic Standing - Balance Support: Right upper extremity supported Dynamic Standing - Level of Assistance: 5: Stand by assistance Dynamic Standing - Balance Activities: Reaching for objects;Lateral lean/weight shifting End of Session PT - End of Session Equipment Utilized During Treatment: Gait belt Activity Tolerance: Patient tolerated treatment well Patient left: in chair;with call bell in reach;with family/visitor present Nurse Communication: Mobility status for ambulation General Behavior During Session: [facility] for tasks performed Cognition: [facility] for tasks performed  Edwyna Perfect, PT  Pager [phone]  04/05/2011, 12:19 PM

## 2011-04-05 NOTE — Progress Notes (Signed)
Patient denies headache. She denies any difficulty speaking. She denies any numbness paresthesias or weakness.  She is afebrile. Her blood pressure and pulse are within normal limits. She is tolerating oral feedings well.   on exam she is awake and alert she is oriented appropriate. Cranial nerve function is intact. Motor and sensory function of the extremities is normal.  Followup head CT scan demonstrates stable appearance of a small left convexity subdural hematoma. She has a stable anterior subarachnoid collection just ventral to her pons. There are no other issues with a CT scan.  Status post fall with a resultant small subdural hematoma. Patient currently asymptomatic. Plan to mobilize with physical and occupational therapy.

## 2011-04-06 ENCOUNTER — Inpatient Hospital Stay (HOSPITAL_COMMUNITY): Payer: Medicare Other

## 2011-04-06 LAB — BASIC METABOLIC PANEL
BUN: 21 mg/dL (ref 6–23)
CO2: 29 mEq/L (ref 19–32)
Calcium: 9.8 mg/dL (ref 8.4–10.5)
Chloride: 97 mEq/L (ref 96–112)
Creatinine, Ser: 1.55 mg/dL — ABNORMAL HIGH (ref 0.50–1.10)
GFR calc Af Amer: 35 mL/min — ABNORMAL LOW (ref >90)

## 2011-04-06 LAB — CBC
HCT: 38.9 % (ref 36.0–46.0)
MCHC: 34.2 g/dL (ref 30.0–36.0)
MCV: 88.8 fL (ref 78.0–100.0)
Platelets: 114 10*3/uL — ABNORMAL LOW (ref 150–400)
RDW: 12.7 % (ref 11.5–15.5)

## 2011-04-06 MED ORDER — POTASSIUM CHLORIDE 10 MEQ/100ML IV SOLN
10.0000 meq | INTRAVENOUS | Status: AC
  Administered 2011-04-07 (×2): 10 meq via INTRAVENOUS
  Filled 2011-04-06 (×2): qty 100

## 2011-04-06 MED ORDER — MOXIFLOXACIN HCL IN NACL 400 MG/250ML IV SOLN
400.0000 mg | Freq: Every day | INTRAVENOUS | Status: DC
  Administered 2011-04-07: 400 mg via INTRAVENOUS
  Filled 2011-04-06 (×2): qty 250

## 2011-04-06 MED ORDER — PANTOPRAZOLE SODIUM 40 MG PO TBEC
40.0000 mg | DELAYED_RELEASE_TABLET | Freq: Every day | ORAL | Status: DC
  Administered 2011-04-06 – 2011-04-08 (×3): 40 mg via ORAL
  Filled 2011-04-06 (×2): qty 1

## 2011-04-06 MED ORDER — HALOPERIDOL LACTATE 5 MG/ML IJ SOLN
2.0000 mg | Freq: Four times a day (QID) | INTRAMUSCULAR | Status: DC | PRN
  Administered 2011-04-06: 2 mg via INTRAVENOUS
  Filled 2011-04-06: qty 1

## 2011-04-06 MED ORDER — SODIUM CHLORIDE 0.9 % IV SOLN
INTRAVENOUS | Status: DC
  Administered 2011-04-06 – 2011-04-08 (×3): via INTRAVENOUS

## 2011-04-06 NOTE — Progress Notes (Signed)
She is awake and without complaint this morning. She states that she is ready to go home.  Her vitals are stable. She is afebrile. On examination she is awake and alert oriented and appropriate. Cranial nerve function is intact. Motor and sensory function of the extremities is normal.  She is doing well following her closed head injury with small subdural hematoma. Plan is to transfer to floor today continue to mobilize slowly and probably discharge to home tomorrow with home therapy.

## 2011-04-06 NOTE — Progress Notes (Signed)
Occupational Therapy Evaluation Patient Details Name: Taylor Coleman MRN: [id number] DOB: [birth year] Today's Date: 04/06/2011 9:48-10:15  Problem List:  Patient Active Problem List  Diagnoses  . Subdural hematoma, post-traumatic    Past Medical History:  Past Medical History  Diagnosis Date  . Hypertension    Past Surgical History:  Past Surgical History  Procedure Date  . Tubal ligation     OT Assessment/Plan/Recommendation OT Assessment Clinical Impression Statement: This 77 yo s/p fall with SDH presents to acute OT with questionable vision deficits (inconsistent with testing). Would recommend HHOT follow-up. No further acute OT needs, will sign off. OT Recommendation/Assessment: All further OT needs can be met in the next venue of care OT Problem List: Impaired vision/perception OT Therapy Diagnosis : Disturbance of vision OT Recommendation Follow Up Recommendations: Home health OT Equipment Recommended: None recommended by OT Individuals Consulted Consulted and Agree with Results and Recommendations: Patient OT Goals    OT Evaluation Precautions/Restrictions  Precautions Precautions: Fall Required Braces or Orthoses: No Restrictions Weight Bearing Restrictions: No Prior Functioning Home Living Lives With: Spouse Receives Help From: Family Type of Home: House Home Layout: One level Home Access: Stairs to enter Entrance Stairs-Rails: None Entrance Stairs-Number of Steps: 1 Bathroom Shower/Tub: Other (comment) (sponge baths) Bathroom Toilet: Standard Bathroom Accessibility: Yes How Accessible: Accessible via walker Home Adaptive Equipment: Bedside commode/3-in-1 Prior Function Level of Independence: Independent with basic ADLs;Independent with homemaking with ambulation;Independent with gait;Independent with transfers Driving: No Vocation: Retired Comments: Pt reports she does IADLs with husband occassionally assisting ADL ADL Eating/Feeding:  Simulated;Independent Where Assessed - Eating/Feeding: Chair Grooming: Simulated;Supervision/safety Grooming Details (indicate cue type and reason): Needs S for setup for safety Where Assessed - Grooming: Standing at sink Upper Body Bathing: Simulated;Supervision/safety Upper Body Bathing Details (indicate cue type and reason): Needs S for setup for safety Where Assessed - Upper Body Bathing: Sit to stand from bed;Sit to stand from chair Lower Body Bathing: Simulated;Supervision/safety Lower Body Bathing Details (indicate cue type and reason): Needs S for setup for safety Where Assessed - Lower Body Bathing: Sit to stand from chair;Sit to stand from bed Upper Body Dressing: Simulated;Supervision/safety Upper Body Dressing Details (indicate cue type and reason): Needs S for setup for safety Where Assessed - Upper Body Dressing: Sit to stand from bed;Sit to stand from chair Lower Body Dressing: Performed;Supervision/safety Lower Body Dressing Details (indicate cue type and reason): Needs S for setup for safety Where Assessed - Lower Body Dressing: Sit to stand from chair;Sit to stand from bed Toilet Transfer: Performed;Supervision/safety Toilet Transfer Method: Proofreader: Raised toilet seat with arms (or 3-in-1 over toilet) Toileting - Clothing Manipulation: Performed;Independent Where Assessed - Toileting Clothing Manipulation: Standing Toileting - Hygiene: Performed;Independent Where Assessed - Toileting Hygiene: Sit on 3-in-1 or toilet Tub/Shower Transfer:  (not applicable--per pt she sponge baths) Tub/Shower Transfer Method: Not assessed Equipment Used: Other (comment) (none) Vision/Perception  Vision - History Baseline Vision: Bifocals Patient Visual Report: No change from baseline Vision - Assessment Eye Alignment: Within Functional Limits Vision Assessment: Vision tested Ocular Range of Motion: Within Functional Limits Tracking/Visual Pursuits:  Decreased smoothness of horizontal tracking;Unable to hold eye position out of midline;Other (comment) (inconsisent with holding eyes to far right) Saccades: Additional eye shifts occurred during testing;Additional head turns occurred during testing Visual Fields: No apparent deficits Additional Comments: When asked pt if she could see the clock she said "yes" when asked her the time she kept looking at the calendar and not the clock which was  to her left, it took her approximately 1 minute to locate the clock and then she got the time wrong on the first try, but correctly on the second try. Perception Perception: Not tested Praxis Praxis: Not tested Cognition Cognition Arousal/Alertness: Awake/alert Overall Cognitive Status: Impaired Attention: Impaired Current Attention Level: Sustained Orientation Level: Oriented X4 Following Commands: Follows one step commands inconsistently;Other (comment) (for visual testing) Sensation/Coordination Sensation Light Touch: Not tested Stereognosis: Not tested Hot/Cold: Not tested Proprioception: Not tested Coordination Gross Motor Movements are Fluid and Coordinated: Yes Fine Motor Movements are Fluid and Coordinated: Yes Extremity Assessment RUE Assessment RUE Assessment: Within Functional Limits LUE Assessment LUE Assessment: Within Functional Limits Mobility  Bed Mobility Bed Mobility: Yes Supine to Sit: 7: Independent;HOB flat Sitting - Scoot to Edge of Bed: 7: Independent Transfers Transfers: Yes Sit to Stand: 6: Modified independent (Device/Increase time);With upper extremity assist;From bed;From toilet Stand to Sit: 6: Modified independent (Device/Increase time);To chair/3-in-1;To toilet;With upper extremity assist Stand to Sit Details: Ambulated around unit 3100 Exercises   End of Session OT - End of Session Equipment Utilized During Treatment: Other (comment) (3-n-1) Activity Tolerance: Patient tolerated treatment  well Patient left: in chair;with call bell in reach General Behavior During Session: [facility] for tasks performed Cognition: Impaired (for vision testing)   Evette Georges [phone] 04/06/2011, 10:39 AM

## 2011-04-06 NOTE — Progress Notes (Addendum)
Transfer to floor on hold due to change in mental status. ( slow mentation compared to earlier today) called Dr pool and stat CT completed.  17:21 transferred to room # 3028. Ct scan reviewed by Dr Jordan Likes and Patient now oriented x3 .

## 2011-04-06 NOTE — Progress Notes (Signed)
Physical Therapy Treatment Patient Details Name: Taylor Coleman MRN: [id number] DOB: [birth year] Today's Date: 04/06/2011  PT Assessment/Plan  PT - Assessment/Plan Comments on Treatment Session: Patient with improved balance and safety today and gait is significantly better with cane. Berg balance test indicates patient at risk of falls but can be independent with use of device. PT Plan: Discharge plan remains appropriate Follow Up Recommendations: Home health PT Equipment Recommended: Gilmer Mor PT Goals  Acute Rehab PT Goals PT Goal: Supine/Side to Sit - Progress: Progressing toward goal PT Goal: Sit to Stand - Progress: Progressing toward goal PT Goal: Stand to Sit - Progress: Progressing toward goal PT Goal: Ambulate - Progress: Progressing toward goal Additional Goals PT Goal: Additional Goal #1 - Progress: Met  PT Treatment Precautions/Restrictions  Precautions Precautions: Fall Restrictions Weight Bearing Restrictions: No Mobility (including Balance) Bed Mobility Supine to Sit: 6: Modified independent (Device/Increase time);HOB flat Sitting - Scoot to Edge of Bed: 6: Modified independent (Device/Increase time) Transfers Sit to Stand: 6: Modified independent (Device/Increase time);From chair/3-in-1;From bed;With upper extremity assist Stand to Sit: 6: Modified independent (Device/Increase time);To chair/3-in-1;With upper extremity assist Ambulation/Gait Ambulation/Gait Assistance: 5: Supervision Ambulation/Gait Assistance Details (indicate cue type and reason): Patient is significantly safer with cane use. She needs very little instruction for correct use of cane. Base of support wider with cane use and increased stride lingth and speed of gait. No evidence of imbalance with gait and able to maintain straight path Ambulation Distance (Feet): 320 Feet Assistive device: Straight cane Gait Pattern: Decreased stride length;Trunk flexed  Static Standing Balance Static Standing -  Balance Support: No upper extremity supported Static Standing - Level of Assistance: 7: Independent Berg Balance Test Sit to Stand: Able to stand  independently using hands Standing Unsupported: Able to stand safely 2 minutes Sitting with Back Unsupported but Feet Supported on Floor or Stool: Able to sit safely and securely 2 minutes Stand to Sit: Sits safely with minimal use of hands Transfers: Able to transfer safely, minor use of hands Standing Unsupported with Eyes Closed: Able to stand 10 seconds safely Standing Ubsupported with Feet Together: Able to place feet together independently and stand 1 minute safely From Standing, Reach Forward with Outstretched Arm: Can reach confidently >25 cm (10") From Standing Position, Pick up Object from Floor: Able to pick up shoe safely and easily From Standing Position, Turn to Look Behind Over each Shoulder: Looks behind from both sides and weight shifts well Turn 360 Degrees: Needs close supervision or verbal cueing Standing Unsupported, Alternately Place Feet on Step/Stool: Able to complete >2 steps/needs minimal assist Standing Unsupported, One Foot in Front: Able to plae foot ahead of the other independently and hold 30 seconds Standing on One Leg: Able to lift leg independently and hold equal to or more than 3 seconds Total Score: 46  End of Session PT - End of Session Equipment Utilized During Treatment: Gait belt Activity Tolerance: Patient tolerated treatment well Patient left: in chair;with call bell in reach;with family/visitor present Nurse Communication: Mobility status for ambulation General Behavior During Session: [facility] for tasks performed Cognition: [facility] for tasks performed  Edwyna Perfect, PT  Pager [phone]  04/06/2011, 9:07 AM

## 2011-04-07 ENCOUNTER — Inpatient Hospital Stay (HOSPITAL_COMMUNITY): Payer: Medicare Other

## 2011-04-07 DIAGNOSIS — B9620 Unspecified Escherichia coli [E. coli] as the cause of diseases classified elsewhere: Secondary | ICD-10-CM | POA: Diagnosis present

## 2011-04-07 LAB — URINE CULTURE
Colony Count: >=100000
Culture  Setup Time: 201212170255

## 2011-04-07 LAB — URINALYSIS, ROUTINE W REFLEX MICROSCOPIC
Bilirubin Urine: NEGATIVE
Ketones, ur: NEGATIVE mg/dL
Nitrite: POSITIVE — AB
Protein, ur: NEGATIVE mg/dL
Urobilinogen, UA: 0.2 mg/dL (ref 0.0–1.0)

## 2011-04-07 MED ORDER — NITROFURANTOIN MACROCRYSTAL 100 MG PO CAPS: 100.0000 mg | ORAL_CAPSULE | Freq: Four times a day (QID) | ORAL | Status: DC

## 2011-04-07 MED ORDER — TOBRAMYCIN SULFATE 80 MG/2ML IJ SOLN
100.0000 mg | INTRAVENOUS | Status: DC
  Administered 2011-04-07: 100 mg via INTRAVENOUS
  Filled 2011-04-07: qty 2.5

## 2011-04-07 MED ORDER — DEXTROSE 5 % IV SOLN
1.0000 g | INTRAVENOUS | Status: DC
  Administered 2011-04-07 – 2011-04-08 (×2): 1 g via INTRAVENOUS
  Filled 2011-04-07 (×3): qty 10

## 2011-04-07 NOTE — Progress Notes (Signed)
Abx protocol:  Pt with e.coli UTI. Pt has PCN allergy. Looked back in echart and documentation is rash. D/w Dr. Jordan Likes, ok to switch pt from tobra to rocephin.   Plan 1. Dc tobra/nitrofurantoin 2. Rocephin 1g IV q24

## 2011-04-07 NOTE — Progress Notes (Addendum)
ANTIBIOTIC CONSULT NOTE - INITIAL  Pharmacy Consult for tobramycin Indication: UTI  Allergies  Allergen Reactions  . Penicillins Other (See Comments)    unknown    Patient Measurements: Height: 5\' 3"  (160 cm) Weight: 126 lb 8.7 oz (57.4 kg) IBW/kg (Calculated) : 52.4   Vital Signs: Temp: 99.6 F (37.6 C) (12/19 0600) BP: 120/77 mmHg (12/19 0937) Pulse Rate: 86  (12/19 0937) Intake/Output from previous day: 12/18 0701 - 12/19 0700 In: 580 [P.O.:480; IV Piggyback:100] Out: -  Intake/Output from this shift:    Labs:  [facility] 04/06/11 2051 04/05/11 0524 04/04/11 1619  WBC 11.4* 9.5 7.1  HGB 13.3 12.6 13.6  PLT 114* 92* 112*  LABCREA -- -- --  CREATININE 1.55* 1.27* 1.58*   Estimated Creatinine Clearance: 23.9 ml/min (by C-G formula based on Cr of 1.55).   Microbiology: Recent Results (from the past 720 hour(s))  URINE CULTURE     Status: Normal   Collection Time   04/04/11  5:12 PM      Component Value Range Status Comment   Specimen Description URINE, CLEAN CATCH   Final    Special Requests NONE ADD 04/04/11 1826   Final    Setup Time [id number]   Final    Colony Count >=100,000 COLONIES/ML   Final    Culture ESCHERICHIA COLI   Final    Report Status 04/07/2011 FINAL   Final    Organism ID, Bacteria ESCHERICHIA COLI   Final   MRSA PCR SCREENING     Status: Normal   Collection Time   04/04/11  8:05 PM      Component Value Range Status Comment   MRSA by PCR NEGATIVE  NEGATIVE  Final     Medical History: Past Medical History  Diagnosis Date  . Hypertension     Medications:  Scheduled:    . enalapril  20 mg Oral Daily  . famotidine  20 mg Oral Daily  . furosemide  40 mg Oral BID  . metoprolol  50 mg Oral BID  . nitrofurantoin  100 mg Oral Q6H  . pantoprazole  40 mg Oral Q1200  . potassium chloride  10 mEq Intravenous Q1 Hr x 2  . senna-docusate  1 tablet Oral BID  . DISCONTD: moxifloxacin  400 mg Intravenous QHS  . DISCONTD: pantoprazole  (PROTONIX) IV  40 mg Intravenous QHS   Assessment: 82 YOF fall backward from escalator suffering SDH.  Urine Culture growing >100K E. Coli (R to quinolones and TMP/SMZ), allergy to PCN (unk rxn and patient confused).  WBC is trending up, Tm=100.5, UA = pyuria.  Will need to dose cautiously for renal fx and age.  Will need lower doses as concentrates in urine.  Nitrofurantoin will not be effective for CrCl < 50 and can increase risk of adverse events.   Goal of Therapy:  Peak = 4-6 trough <1  Plan:  1. Tobramycin 100mg  IV x1 then 100mg  IV q48h for est Pk=6 and trough <1.  2. Check tobramycin levels as needed based on duration of therapy and renal function.  3. BMP in am, watch renal fx closely with concomitant ACE-I and loop diuretic  Dannielle Huh 04/07/2011,10:24 AM

## 2011-04-07 NOTE — Progress Notes (Signed)
Patient more confused yesterday. A followup head CT scan demonstrated no change in her small subdural hematoma. No new edema or other abnormalities. Workup has demonstrated a Escherichia coli UTI which is resistant to quinolones and sulfa medications. The patient is significantly penicillin allergic. Plan to start tobramycin per pharmacy consult. Will transition to Macrodantin when patient mental status improves.  Currently with low-grade fever. White count is elevated to 11,000. Hemodynamically stable. Motor and sensory examination of the extremities remained intact. She is awake but confused and much less cooperative today.  Status post closed head injury with small subdural hematoma. I situation being complicated by a Escherichia coli urinary tract infection. Plan to treat with IV antibiotics and mobilize as tolerated. Looking towards home discharge once urosepsis has resolved.

## 2011-04-07 NOTE — Progress Notes (Signed)
Physical Therapy Treatment Patient Details Name: Taylor Coleman MRN: [id number] DOB: [birth year] Today's Date: 04/07/2011  PT Assessment/Plan  PT - Assessment/Plan Comments on Treatment Session: Patient with decline in function likely secondary to confusion from newly diagnosed UTI. PT Plan: Discharge plan remains appropriate (Anticipate return to baseline with treatment for UTI) Follow Up Recommendations: Home health PT Equipment Recommended: Cane PT Goals  Acute Rehab PT Goals PT Goal: Supine/Side to Sit - Progress: Progressing toward goal PT Goal: Sit to Supine/Side - Progress: Progressing toward goal PT Goal: Sit to Stand - Progress: Progressing toward goal PT Goal: Stand to Sit - Progress: Progressing toward goal PT Goal: Ambulate - Progress: Progressing toward goal  PT Treatment Precautions/Restrictions  Precautions Precautions: Fall Required Braces or Orthoses: No Restrictions Weight Bearing Restrictions: No Mobility (including Balance) Bed Mobility Supine to Sit: 5: Supervision Supine to Sit Details (indicate cue type and reason): HOB 30 degrees. Increased time to complete task - no cues needed Sit to Supine - Right: 5: Supervision Sit to Supine - Right Details (indicate cue type and reason): Cues to position stright in bed Transfers Sit to Stand: 5: Supervision;From bed;With upper extremity assist Sit to Stand Details (indicate cue type and reason): Increased effort for stand today. Increased upper extremity reliance. Stand to Sit: 5: Supervision;To bed;With upper extremity assist Ambulation/Gait Ambulation/Gait Assistance: 4: Min assist Ambulation/Gait Assistance Details (indicate cue type and reason): Patient unsafe with cane use - carrying today. Also unsafe holding to IV pole as can not keep out of way and kicked once with foot. Needs assistance to move IV out to side so no potential ot kick, nad needs assistance for balance maintenance. Verbal cues for posture and  safety. Ambulation Distance (Feet): 150 Feet Assistive device: 1 person hand held assist Gait Pattern: Trunk flexed;Decreased stride length  Static Standing Balance Static Standing - Balance Support: Right upper extremity supported Static Standing - Level of Assistance: 5: Stand by assistance End of Session PT - End of Session Equipment Utilized During Treatment: Gait belt Activity Tolerance:  (Limited by confusion) Patient left: in bed;with bed alarm set;with family/visitor present;with call bell in reach General Cognition: Impaired Cognitive Impairment: Not orientated to place/time. Confused. Decreased attention, safety, and memory.  Edwyna Perfect, PT  Pager [phone]  04/07/2011, 10:20 AM

## 2011-04-07 NOTE — Progress Notes (Signed)
Pt observed to very restless,agitated and confused at 2000,Dr Wynetta Emery (on call)paged and notified,ordered to give haldol 2mg  IV and to draw a CBC,BMP and to call with abnormal results,pt restarted on iv fliuds,same commenced as ordered,family at bedside. Potassium level came back 3.3, and pt also had a temp of 100.5 at 2200,attepmted to give tab tylenol but pt refused to swallow,spat it back,Dr Wynetta Emery repaged at 2300 and notified,more orders given, CXR,UA/CS,blood cultures and to begin Avelox iv after blood cultures have been drawn,will however continue to monitor. Taylor Coleman, Taylor Coleman

## 2011-04-08 LAB — BASIC METABOLIC PANEL
BUN: 26 mg/dL — ABNORMAL HIGH (ref 6–23)
CO2: 24 mEq/L (ref 19–32)
Chloride: 98 mEq/L (ref 96–112)
GFR calc Af Amer: 35 mL/min — ABNORMAL LOW (ref >90)
Glucose, Bld: 158 mg/dL — ABNORMAL HIGH (ref 70–99)
Potassium: 4.8 mEq/L (ref 3.5–5.1)

## 2011-04-08 NOTE — Progress Notes (Signed)
Looks much better today. Patient afebrile. Confusion resolved. Asking to go home.  On exam she is neurologically intact. Her wound is healing well. No other problems identified.  Continue IV antibiotics for 24 more hours. Probable discharge home tomorrow.

## 2011-04-08 NOTE — Progress Notes (Signed)
Physical Therapy Treatment Patient Details Name: Taylor Coleman MRN: [id number] DOB: [birth year] Today's Date: 04/08/2011  PT Assessment/Plan  PT - Assessment/Plan Comments on Treatment Session: Patient with improved safety with mobility and cognition improving PT Plan: Discharge plan remains appropriate Follow Up Recommendations: Home health PT Equipment Recommended: Cane PT Goals  Acute Rehab PT Goals PT Goal: Supine/Side to Sit - Progress: Met PT Goal: Sit to Supine/Side - Progress: Met PT Goal: Sit to Stand - Progress: Met PT Goal: Stand to Sit - Progress: Met PT Goal: Ambulate - Progress: Progressing toward goal PT Goal: Up/Down Stairs - Progress: Progressing toward goal  PT Treatment Precautions/Restrictions  Precautions Precautions: Fall Required Braces or Orthoses: No Restrictions Weight Bearing Restrictions: No Mobility (including Balance) Bed Mobility Supine to Sit: 7: Independent Sitting - Scoot to Edge of Bed: 7: Independent Sit to Supine - Right: 7: Independent Transfers Sit to Stand: 6: Modified independent (Device/Increase time);From bed Stand to Sit: 6: Modified independent (Device/Increase time);To bed Ambulation/Gait Ambulation/Gait Assistance: 5: Supervision Ambulation/Gait Assistance Details (indicate cue type and reason): Patient able to use cane correctly again without cueing required. Supervision for safety secondary to mild confusion. Ambulation Distance (Feet): 250 Feet Assistive device: Straight cane Gait Pattern: Trunk flexed;Decreased stride length Stairs: Yes Stairs Assistance: 4: Min assist Stairs Assistance Details (indicate cue type and reason): For safety secondary to mild imbalance with ascending and descending with just cane use Stair Management Technique: With cane Number of Stairs: 2  Height of Stairs: 8   Static Standing Balance Static Standing - Balance Support: No upper extremity supported Static Standing - Level of Assistance: 7:  Independent End of Session PT - End of Session Equipment Utilized During Treatment: Gait belt Activity Tolerance: Patient tolerated treatment well Patient left: in bed;with call bell in reach;with family/visitor present;with bed alarm set General Behavior During Session: [facility] for tasks performed Cognitive Impairment: Some delay in responses. Orientated to place/time.  Edwyna Perfect, PT  Pager [phone]  04/08/2011, 11:25 AM

## 2011-04-09 MED ORDER — NITROFURANTOIN MACROCRYSTAL 100 MG PO CAPS: 100.0000 mg | ORAL_CAPSULE | Freq: Four times a day (QID) | ORAL | Status: AC

## 2011-04-09 NOTE — Progress Notes (Signed)
Utilization review completed. Donold Marotto, RN, BSN. 04/09/11  

## 2011-04-09 NOTE — Discharge Summary (Signed)
Physician Discharge Summary  Patient ID: Taylor Coleman MRN: [id number] DOB/AGE: 24-Jul-1928 77 y.o.  Admit date: 04/04/2011 Discharge date: 04/09/2011  Admission Diagnoses:  Discharge Diagnoses:  Principal Problem:  *Subdural hematoma, post-traumatic Active Problems:  Urinary tract infection, E. coli   Discharged Condition: good  Hospital Course: Patient was admitted to the hospital for treatment of her acute left-sided posttraumatic subdural hematoma. She was observed in the intensive. It where she was neurologically stable. Followup head CT scan demonstrated no change in her small left convexity subdural hematoma. She also has a moderate amount of pre-pontine subarachnoid blood. She was doing very well, when she developed the acute onset of confusion. Followup head CT scan was unchanged. Further workup demonstrated evidence of an Escherichia coli urinary tract infection. The patient has been treated with IV antibiotics with marked improvement of her symptoms. Plan is for discharge home on oral antibiotics. Currently she is up and Lipitor he with minimal assistance. She denies any headache. She feels well.    Consults: none  Significant Diagnostic Studies: Head CT scans  Treatments: None   Discharge Exam: Blood pressure 151/82, pulse 76, temperature 98.2 F (36.8 C), temperature source Oral, resp. rate 16, height 5\' 3"  (1.6 m), weight 57.4 kg (126 lb 8.7 oz), SpO2 95.00%. She is awake alert oriented and appropriate speech is fluent cranial nerve function is intact motor and sensory function of the extremities is normal. She has a well-healing occipital laceration which is then stapled closed. Chest and abdominal exam are benign.  Disposition: Final discharge disposition not confirmed   Medication List  As of 04/09/2011  7:56 AM   START taking these medications         nitrofurantoin 100 MG capsule   Commonly known as: MACRODANTIN   Take 1 capsule (100 mg total) by mouth 4  (four) times daily.         CONTINUE taking these medications         enalapril 20 MG tablet   Commonly known as: VASOTEC      furosemide 40 MG tablet   Commonly known as: LASIX      metoprolol 50 MG tablet   Commonly known as: LOPRESSOR      nitroGLYCERIN 0.4 MG SL tablet   Commonly known as: NITROSTAT      ranitidine 150 MG tablet   Commonly known as: ZANTAC          Where to get your medications    These are the prescriptions that you need to pick up.   You may get these medications from any pharmacy.         nitrofurantoin 100 MG capsule           Follow-up Information    Follow up with Arantxa Piercey A. Call in 2 weeks.   Contact information:   [address] [place] [address] [phone]          Signed: Temple Pacini 04/09/2011, 7:56 AM

## 2011-04-13 LAB — CULTURE, BLOOD (ROUTINE X 2): Culture: NO GROWTH

## 2014-05-24 DIAGNOSIS — Z7189 Other specified counseling: Secondary | ICD-10-CM | POA: Diagnosis not present

## 2014-05-24 DIAGNOSIS — I509 Heart failure, unspecified: Secondary | ICD-10-CM | POA: Diagnosis not present

## 2014-05-24 DIAGNOSIS — E119 Type 2 diabetes mellitus without complications: Secondary | ICD-10-CM | POA: Diagnosis not present

## 2014-05-24 DIAGNOSIS — N289 Disorder of kidney and ureter, unspecified: Secondary | ICD-10-CM | POA: Diagnosis not present

## 2014-05-24 DIAGNOSIS — I10 Essential (primary) hypertension: Secondary | ICD-10-CM | POA: Diagnosis not present

## 2014-05-24 DIAGNOSIS — M109 Gout, unspecified: Secondary | ICD-10-CM | POA: Diagnosis not present

## 2014-08-27 DIAGNOSIS — I10 Essential (primary) hypertension: Secondary | ICD-10-CM | POA: Diagnosis not present

## 2014-08-27 DIAGNOSIS — E119 Type 2 diabetes mellitus without complications: Secondary | ICD-10-CM | POA: Diagnosis not present

## 2014-08-27 DIAGNOSIS — J449 Chronic obstructive pulmonary disease, unspecified: Secondary | ICD-10-CM | POA: Diagnosis not present

## 2014-08-27 DIAGNOSIS — M109 Gout, unspecified: Secondary | ICD-10-CM | POA: Diagnosis not present

## 2014-11-28 DIAGNOSIS — I10 Essential (primary) hypertension: Secondary | ICD-10-CM | POA: Diagnosis not present

## 2014-11-28 DIAGNOSIS — N289 Disorder of kidney and ureter, unspecified: Secondary | ICD-10-CM | POA: Diagnosis not present

## 2014-11-28 DIAGNOSIS — Z1389 Encounter for screening for other disorder: Secondary | ICD-10-CM | POA: Diagnosis not present

## 2014-11-28 DIAGNOSIS — Z9181 History of falling: Secondary | ICD-10-CM | POA: Diagnosis not present

## 2014-11-28 DIAGNOSIS — E119 Type 2 diabetes mellitus without complications: Secondary | ICD-10-CM | POA: Diagnosis not present

## 2014-11-28 DIAGNOSIS — Z139 Encounter for screening, unspecified: Secondary | ICD-10-CM | POA: Diagnosis not present

## 2015-02-10 DIAGNOSIS — I509 Heart failure, unspecified: Secondary | ICD-10-CM | POA: Diagnosis not present

## 2015-02-10 DIAGNOSIS — R002 Palpitations: Secondary | ICD-10-CM | POA: Diagnosis not present

## 2015-02-10 DIAGNOSIS — G4700 Insomnia, unspecified: Secondary | ICD-10-CM | POA: Diagnosis not present

## 2015-02-10 DIAGNOSIS — R0602 Shortness of breath: Secondary | ICD-10-CM | POA: Diagnosis not present

## 2015-02-10 DIAGNOSIS — N289 Disorder of kidney and ureter, unspecified: Secondary | ICD-10-CM | POA: Diagnosis not present

## 2015-02-10 DIAGNOSIS — M109 Gout, unspecified: Secondary | ICD-10-CM | POA: Diagnosis not present

## 2015-02-10 DIAGNOSIS — E119 Type 2 diabetes mellitus without complications: Secondary | ICD-10-CM | POA: Diagnosis not present

## 2015-02-10 DIAGNOSIS — I10 Essential (primary) hypertension: Secondary | ICD-10-CM | POA: Diagnosis not present

## 2015-02-13 DIAGNOSIS — R079 Chest pain, unspecified: Secondary | ICD-10-CM | POA: Diagnosis not present

## 2015-02-13 DIAGNOSIS — Z6820 Body mass index (BMI) 20.0-20.9, adult: Secondary | ICD-10-CM | POA: Diagnosis not present

## 2015-02-13 DIAGNOSIS — R0602 Shortness of breath: Secondary | ICD-10-CM | POA: Diagnosis not present

## 2015-02-14 DIAGNOSIS — R0602 Shortness of breath: Secondary | ICD-10-CM | POA: Diagnosis not present

## 2015-02-18 DIAGNOSIS — R0602 Shortness of breath: Secondary | ICD-10-CM | POA: Diagnosis not present

## 2015-02-18 DIAGNOSIS — Z6821 Body mass index (BMI) 21.0-21.9, adult: Secondary | ICD-10-CM | POA: Diagnosis not present

## 2015-02-18 DIAGNOSIS — I509 Heart failure, unspecified: Secondary | ICD-10-CM | POA: Diagnosis not present

## 2015-02-25 DIAGNOSIS — D649 Anemia, unspecified: Secondary | ICD-10-CM | POA: Diagnosis not present

## 2015-02-25 DIAGNOSIS — R0602 Shortness of breath: Secondary | ICD-10-CM | POA: Diagnosis not present

## 2015-02-25 DIAGNOSIS — G4700 Insomnia, unspecified: Secondary | ICD-10-CM | POA: Diagnosis not present

## 2015-02-25 DIAGNOSIS — N289 Disorder of kidney and ureter, unspecified: Secondary | ICD-10-CM | POA: Diagnosis not present

## 2015-02-25 DIAGNOSIS — I509 Heart failure, unspecified: Secondary | ICD-10-CM | POA: Diagnosis not present

## 2015-02-25 DIAGNOSIS — M109 Gout, unspecified: Secondary | ICD-10-CM | POA: Diagnosis not present

## 2015-03-10 DIAGNOSIS — N289 Disorder of kidney and ureter, unspecified: Secondary | ICD-10-CM | POA: Diagnosis not present

## 2015-03-10 DIAGNOSIS — Z6821 Body mass index (BMI) 21.0-21.9, adult: Secondary | ICD-10-CM | POA: Diagnosis not present

## 2015-03-10 DIAGNOSIS — I509 Heart failure, unspecified: Secondary | ICD-10-CM | POA: Diagnosis not present

## 2015-03-10 DIAGNOSIS — E119 Type 2 diabetes mellitus without complications: Secondary | ICD-10-CM | POA: Diagnosis not present

## 2015-03-10 DIAGNOSIS — R0602 Shortness of breath: Secondary | ICD-10-CM | POA: Diagnosis not present

## 2015-03-24 DIAGNOSIS — E119 Type 2 diabetes mellitus without complications: Secondary | ICD-10-CM | POA: Diagnosis not present

## 2015-03-24 DIAGNOSIS — Z6821 Body mass index (BMI) 21.0-21.9, adult: Secondary | ICD-10-CM | POA: Diagnosis not present

## 2015-03-24 DIAGNOSIS — N289 Disorder of kidney and ureter, unspecified: Secondary | ICD-10-CM | POA: Diagnosis not present

## 2015-03-24 DIAGNOSIS — I10 Essential (primary) hypertension: Secondary | ICD-10-CM | POA: Diagnosis not present

## 2015-03-24 DIAGNOSIS — M109 Gout, unspecified: Secondary | ICD-10-CM | POA: Diagnosis not present

## 2015-03-24 DIAGNOSIS — G4700 Insomnia, unspecified: Secondary | ICD-10-CM | POA: Diagnosis not present

## 2015-03-24 DIAGNOSIS — I509 Heart failure, unspecified: Secondary | ICD-10-CM | POA: Diagnosis not present

## 2015-05-05 DIAGNOSIS — M818 Other osteoporosis without current pathological fracture: Secondary | ICD-10-CM | POA: Diagnosis not present

## 2015-05-05 DIAGNOSIS — Z6822 Body mass index (BMI) 22.0-22.9, adult: Secondary | ICD-10-CM | POA: Diagnosis not present

## 2015-05-05 DIAGNOSIS — Z79899 Other long term (current) drug therapy: Secondary | ICD-10-CM | POA: Diagnosis not present

## 2015-05-05 DIAGNOSIS — R0602 Shortness of breath: Secondary | ICD-10-CM | POA: Diagnosis not present

## 2015-05-05 DIAGNOSIS — I509 Heart failure, unspecified: Secondary | ICD-10-CM | POA: Diagnosis not present

## 2015-05-05 DIAGNOSIS — M109 Gout, unspecified: Secondary | ICD-10-CM | POA: Diagnosis not present

## 2015-05-05 DIAGNOSIS — G4700 Insomnia, unspecified: Secondary | ICD-10-CM | POA: Diagnosis not present

## 2015-05-05 DIAGNOSIS — I10 Essential (primary) hypertension: Secondary | ICD-10-CM | POA: Diagnosis not present

## 2015-05-06 DIAGNOSIS — Z79899 Other long term (current) drug therapy: Secondary | ICD-10-CM | POA: Diagnosis not present

## 2015-05-06 DIAGNOSIS — R531 Weakness: Secondary | ICD-10-CM | POA: Diagnosis not present

## 2015-05-06 DIAGNOSIS — D62 Acute posthemorrhagic anemia: Secondary | ICD-10-CM | POA: Diagnosis not present

## 2015-05-06 DIAGNOSIS — R0609 Other forms of dyspnea: Secondary | ICD-10-CM | POA: Diagnosis not present

## 2015-05-06 DIAGNOSIS — K222 Esophageal obstruction: Secondary | ICD-10-CM | POA: Diagnosis not present

## 2015-05-06 DIAGNOSIS — K920 Hematemesis: Secondary | ICD-10-CM | POA: Diagnosis not present

## 2015-05-06 DIAGNOSIS — N183 Chronic kidney disease, stage 3 (moderate): Secondary | ICD-10-CM | POA: Diagnosis not present

## 2015-05-06 DIAGNOSIS — I129 Hypertensive chronic kidney disease with stage 1 through stage 4 chronic kidney disease, or unspecified chronic kidney disease: Secondary | ICD-10-CM | POA: Diagnosis not present

## 2015-05-06 DIAGNOSIS — K449 Diaphragmatic hernia without obstruction or gangrene: Secondary | ICD-10-CM | POA: Diagnosis not present

## 2015-05-06 DIAGNOSIS — Z7982 Long term (current) use of aspirin: Secondary | ICD-10-CM | POA: Diagnosis not present

## 2015-05-06 DIAGNOSIS — K259 Gastric ulcer, unspecified as acute or chronic, without hemorrhage or perforation: Secondary | ICD-10-CM | POA: Diagnosis not present

## 2015-05-06 DIAGNOSIS — K921 Melena: Secondary | ICD-10-CM | POA: Diagnosis not present

## 2015-05-06 DIAGNOSIS — N289 Disorder of kidney and ureter, unspecified: Secondary | ICD-10-CM | POA: Diagnosis not present

## 2015-05-06 DIAGNOSIS — D649 Anemia, unspecified: Secondary | ICD-10-CM | POA: Diagnosis not present

## 2015-05-07 DIAGNOSIS — K920 Hematemesis: Secondary | ICD-10-CM | POA: Diagnosis not present

## 2015-05-07 DIAGNOSIS — D62 Acute posthemorrhagic anemia: Secondary | ICD-10-CM | POA: Diagnosis not present

## 2015-05-14 DIAGNOSIS — M109 Gout, unspecified: Secondary | ICD-10-CM | POA: Diagnosis not present

## 2015-05-14 DIAGNOSIS — D649 Anemia, unspecified: Secondary | ICD-10-CM | POA: Diagnosis not present

## 2015-05-14 DIAGNOSIS — G4700 Insomnia, unspecified: Secondary | ICD-10-CM | POA: Diagnosis not present

## 2015-05-14 DIAGNOSIS — I509 Heart failure, unspecified: Secondary | ICD-10-CM | POA: Diagnosis not present

## 2015-05-14 DIAGNOSIS — I10 Essential (primary) hypertension: Secondary | ICD-10-CM | POA: Diagnosis not present

## 2015-05-14 DIAGNOSIS — K922 Gastrointestinal hemorrhage, unspecified: Secondary | ICD-10-CM | POA: Diagnosis not present

## 2015-06-02 DIAGNOSIS — K922 Gastrointestinal hemorrhage, unspecified: Secondary | ICD-10-CM | POA: Diagnosis not present

## 2015-06-02 DIAGNOSIS — I10 Essential (primary) hypertension: Secondary | ICD-10-CM | POA: Diagnosis not present

## 2015-06-02 DIAGNOSIS — N289 Disorder of kidney and ureter, unspecified: Secondary | ICD-10-CM | POA: Diagnosis not present

## 2015-06-02 DIAGNOSIS — I509 Heart failure, unspecified: Secondary | ICD-10-CM | POA: Diagnosis not present

## 2015-06-02 DIAGNOSIS — M109 Gout, unspecified: Secondary | ICD-10-CM | POA: Diagnosis not present

## 2015-06-02 DIAGNOSIS — E119 Type 2 diabetes mellitus without complications: Secondary | ICD-10-CM | POA: Diagnosis not present

## 2015-06-02 DIAGNOSIS — G4700 Insomnia, unspecified: Secondary | ICD-10-CM | POA: Diagnosis not present

## 2015-06-30 DIAGNOSIS — N289 Disorder of kidney and ureter, unspecified: Secondary | ICD-10-CM | POA: Diagnosis not present

## 2015-06-30 DIAGNOSIS — K922 Gastrointestinal hemorrhage, unspecified: Secondary | ICD-10-CM | POA: Diagnosis not present

## 2015-06-30 DIAGNOSIS — Z6820 Body mass index (BMI) 20.0-20.9, adult: Secondary | ICD-10-CM | POA: Diagnosis not present

## 2015-06-30 DIAGNOSIS — G4700 Insomnia, unspecified: Secondary | ICD-10-CM | POA: Diagnosis not present

## 2015-06-30 DIAGNOSIS — I509 Heart failure, unspecified: Secondary | ICD-10-CM | POA: Diagnosis not present

## 2015-06-30 DIAGNOSIS — M109 Gout, unspecified: Secondary | ICD-10-CM | POA: Diagnosis not present

## 2015-07-02 DIAGNOSIS — K920 Hematemesis: Secondary | ICD-10-CM | POA: Diagnosis not present

## 2015-07-02 DIAGNOSIS — D62 Acute posthemorrhagic anemia: Secondary | ICD-10-CM | POA: Diagnosis not present

## 2015-07-02 DIAGNOSIS — K250 Acute gastric ulcer with hemorrhage: Secondary | ICD-10-CM | POA: Diagnosis not present

## 2015-07-29 DIAGNOSIS — D509 Iron deficiency anemia, unspecified: Secondary | ICD-10-CM | POA: Diagnosis not present

## 2015-07-29 DIAGNOSIS — I252 Old myocardial infarction: Secondary | ICD-10-CM | POA: Diagnosis not present

## 2015-07-29 DIAGNOSIS — K2971 Gastritis, unspecified, with bleeding: Secondary | ICD-10-CM | POA: Diagnosis not present

## 2015-07-29 DIAGNOSIS — K2970 Gastritis, unspecified, without bleeding: Secondary | ICD-10-CM | POA: Diagnosis not present

## 2015-07-29 DIAGNOSIS — I10 Essential (primary) hypertension: Secondary | ICD-10-CM | POA: Diagnosis not present

## 2015-07-29 DIAGNOSIS — F17220 Nicotine dependence, chewing tobacco, uncomplicated: Secondary | ICD-10-CM | POA: Diagnosis not present

## 2015-07-29 DIAGNOSIS — K250 Acute gastric ulcer with hemorrhage: Secondary | ICD-10-CM | POA: Diagnosis not present

## 2015-07-29 DIAGNOSIS — Q2733 Arteriovenous malformation of digestive system vessel: Secondary | ICD-10-CM | POA: Diagnosis not present

## 2015-07-29 DIAGNOSIS — K259 Gastric ulcer, unspecified as acute or chronic, without hemorrhage or perforation: Secondary | ICD-10-CM | POA: Diagnosis not present

## 2015-07-29 DIAGNOSIS — Z79899 Other long term (current) drug therapy: Secondary | ICD-10-CM | POA: Diagnosis not present

## 2015-07-31 DIAGNOSIS — D649 Anemia, unspecified: Secondary | ICD-10-CM | POA: Diagnosis not present

## 2015-07-31 DIAGNOSIS — N289 Disorder of kidney and ureter, unspecified: Secondary | ICD-10-CM | POA: Diagnosis not present

## 2015-09-01 DIAGNOSIS — E782 Mixed hyperlipidemia: Secondary | ICD-10-CM | POA: Diagnosis not present

## 2015-09-01 DIAGNOSIS — I509 Heart failure, unspecified: Secondary | ICD-10-CM | POA: Diagnosis not present

## 2015-09-01 DIAGNOSIS — F419 Anxiety disorder, unspecified: Secondary | ICD-10-CM | POA: Diagnosis not present

## 2015-09-01 DIAGNOSIS — K922 Gastrointestinal hemorrhage, unspecified: Secondary | ICD-10-CM | POA: Diagnosis not present

## 2015-09-01 DIAGNOSIS — E119 Type 2 diabetes mellitus without complications: Secondary | ICD-10-CM | POA: Diagnosis not present

## 2015-09-01 DIAGNOSIS — M109 Gout, unspecified: Secondary | ICD-10-CM | POA: Diagnosis not present

## 2015-09-01 DIAGNOSIS — I10 Essential (primary) hypertension: Secondary | ICD-10-CM | POA: Diagnosis not present

## 2015-09-01 DIAGNOSIS — N289 Disorder of kidney and ureter, unspecified: Secondary | ICD-10-CM | POA: Diagnosis not present

## 2015-09-01 DIAGNOSIS — G4700 Insomnia, unspecified: Secondary | ICD-10-CM | POA: Diagnosis not present

## 2015-10-02 DIAGNOSIS — G4700 Insomnia, unspecified: Secondary | ICD-10-CM | POA: Diagnosis not present

## 2015-10-02 DIAGNOSIS — I509 Heart failure, unspecified: Secondary | ICD-10-CM | POA: Diagnosis not present

## 2015-10-02 DIAGNOSIS — E119 Type 2 diabetes mellitus without complications: Secondary | ICD-10-CM | POA: Diagnosis not present

## 2015-10-02 DIAGNOSIS — Z681 Body mass index (BMI) 19 or less, adult: Secondary | ICD-10-CM | POA: Diagnosis not present

## 2015-10-02 DIAGNOSIS — E782 Mixed hyperlipidemia: Secondary | ICD-10-CM | POA: Diagnosis not present

## 2015-10-02 DIAGNOSIS — M109 Gout, unspecified: Secondary | ICD-10-CM | POA: Diagnosis not present

## 2015-10-02 DIAGNOSIS — N289 Disorder of kidney and ureter, unspecified: Secondary | ICD-10-CM | POA: Diagnosis not present

## 2015-10-02 DIAGNOSIS — K922 Gastrointestinal hemorrhage, unspecified: Secondary | ICD-10-CM | POA: Diagnosis not present

## 2015-10-02 DIAGNOSIS — I10 Essential (primary) hypertension: Secondary | ICD-10-CM | POA: Diagnosis not present

## 2015-11-03 DIAGNOSIS — N289 Disorder of kidney and ureter, unspecified: Secondary | ICD-10-CM | POA: Diagnosis not present

## 2015-11-03 DIAGNOSIS — I10 Essential (primary) hypertension: Secondary | ICD-10-CM | POA: Diagnosis not present

## 2015-11-03 DIAGNOSIS — I509 Heart failure, unspecified: Secondary | ICD-10-CM | POA: Diagnosis not present

## 2015-11-03 DIAGNOSIS — M109 Gout, unspecified: Secondary | ICD-10-CM | POA: Diagnosis not present

## 2015-11-03 DIAGNOSIS — E119 Type 2 diabetes mellitus without complications: Secondary | ICD-10-CM | POA: Diagnosis not present

## 2015-11-03 DIAGNOSIS — G4700 Insomnia, unspecified: Secondary | ICD-10-CM | POA: Diagnosis not present

## 2015-11-03 DIAGNOSIS — D508 Other iron deficiency anemias: Secondary | ICD-10-CM | POA: Diagnosis not present

## 2015-11-03 DIAGNOSIS — K922 Gastrointestinal hemorrhage, unspecified: Secondary | ICD-10-CM | POA: Diagnosis not present

## 2015-12-08 DIAGNOSIS — M818 Other osteoporosis without current pathological fracture: Secondary | ICD-10-CM | POA: Diagnosis not present

## 2015-12-08 DIAGNOSIS — Z681 Body mass index (BMI) 19 or less, adult: Secondary | ICD-10-CM | POA: Diagnosis not present

## 2015-12-08 DIAGNOSIS — K922 Gastrointestinal hemorrhage, unspecified: Secondary | ICD-10-CM | POA: Diagnosis not present

## 2015-12-08 DIAGNOSIS — E119 Type 2 diabetes mellitus without complications: Secondary | ICD-10-CM | POA: Diagnosis not present

## 2015-12-08 DIAGNOSIS — I509 Heart failure, unspecified: Secondary | ICD-10-CM | POA: Diagnosis not present

## 2015-12-08 DIAGNOSIS — Z79899 Other long term (current) drug therapy: Secondary | ICD-10-CM | POA: Diagnosis not present

## 2015-12-08 DIAGNOSIS — D508 Other iron deficiency anemias: Secondary | ICD-10-CM | POA: Diagnosis not present

## 2015-12-08 DIAGNOSIS — N289 Disorder of kidney and ureter, unspecified: Secondary | ICD-10-CM | POA: Diagnosis not present

## 2015-12-08 DIAGNOSIS — Z9181 History of falling: Secondary | ICD-10-CM | POA: Diagnosis not present

## 2016-01-09 DIAGNOSIS — E119 Type 2 diabetes mellitus without complications: Secondary | ICD-10-CM | POA: Diagnosis not present

## 2016-01-09 DIAGNOSIS — I509 Heart failure, unspecified: Secondary | ICD-10-CM | POA: Diagnosis not present

## 2016-01-09 DIAGNOSIS — M109 Gout, unspecified: Secondary | ICD-10-CM | POA: Diagnosis not present

## 2016-01-09 DIAGNOSIS — E538 Deficiency of other specified B group vitamins: Secondary | ICD-10-CM | POA: Diagnosis not present

## 2016-01-09 DIAGNOSIS — D508 Other iron deficiency anemias: Secondary | ICD-10-CM | POA: Diagnosis not present

## 2016-01-09 DIAGNOSIS — G4700 Insomnia, unspecified: Secondary | ICD-10-CM | POA: Diagnosis not present

## 2016-01-09 DIAGNOSIS — N289 Disorder of kidney and ureter, unspecified: Secondary | ICD-10-CM | POA: Diagnosis not present

## 2016-01-09 DIAGNOSIS — K922 Gastrointestinal hemorrhage, unspecified: Secondary | ICD-10-CM | POA: Diagnosis not present

## 2016-01-09 DIAGNOSIS — I10 Essential (primary) hypertension: Secondary | ICD-10-CM | POA: Diagnosis not present

## 2016-01-31 DIAGNOSIS — I4891 Unspecified atrial fibrillation: Secondary | ICD-10-CM | POA: Diagnosis not present

## 2016-01-31 DIAGNOSIS — R002 Palpitations: Secondary | ICD-10-CM | POA: Diagnosis not present

## 2016-01-31 DIAGNOSIS — I252 Old myocardial infarction: Secondary | ICD-10-CM | POA: Diagnosis not present

## 2016-01-31 DIAGNOSIS — I10 Essential (primary) hypertension: Secondary | ICD-10-CM | POA: Diagnosis not present

## 2016-02-09 DIAGNOSIS — E538 Deficiency of other specified B group vitamins: Secondary | ICD-10-CM | POA: Diagnosis not present

## 2016-02-09 DIAGNOSIS — D649 Anemia, unspecified: Secondary | ICD-10-CM | POA: Diagnosis not present

## 2016-02-09 DIAGNOSIS — Z6822 Body mass index (BMI) 22.0-22.9, adult: Secondary | ICD-10-CM | POA: Diagnosis not present

## 2016-02-09 DIAGNOSIS — R0602 Shortness of breath: Secondary | ICD-10-CM | POA: Diagnosis not present

## 2016-02-09 DIAGNOSIS — K922 Gastrointestinal hemorrhage, unspecified: Secondary | ICD-10-CM | POA: Diagnosis not present

## 2016-02-09 DIAGNOSIS — I509 Heart failure, unspecified: Secondary | ICD-10-CM | POA: Diagnosis not present

## 2016-02-12 DIAGNOSIS — I4891 Unspecified atrial fibrillation: Secondary | ICD-10-CM | POA: Diagnosis not present

## 2016-02-12 DIAGNOSIS — Z6821 Body mass index (BMI) 21.0-21.9, adult: Secondary | ICD-10-CM | POA: Diagnosis not present

## 2016-02-12 DIAGNOSIS — D649 Anemia, unspecified: Secondary | ICD-10-CM | POA: Diagnosis not present

## 2016-02-12 DIAGNOSIS — I509 Heart failure, unspecified: Secondary | ICD-10-CM | POA: Diagnosis not present

## 2016-02-12 DIAGNOSIS — N289 Disorder of kidney and ureter, unspecified: Secondary | ICD-10-CM | POA: Diagnosis not present

## 2016-02-12 DIAGNOSIS — I10 Essential (primary) hypertension: Secondary | ICD-10-CM | POA: Diagnosis not present

## 2016-02-12 DIAGNOSIS — R0602 Shortness of breath: Secondary | ICD-10-CM | POA: Diagnosis not present

## 2016-02-19 DIAGNOSIS — G4700 Insomnia, unspecified: Secondary | ICD-10-CM | POA: Diagnosis not present

## 2016-02-19 DIAGNOSIS — D649 Anemia, unspecified: Secondary | ICD-10-CM | POA: Diagnosis not present

## 2016-02-19 DIAGNOSIS — I10 Essential (primary) hypertension: Secondary | ICD-10-CM | POA: Diagnosis not present

## 2016-02-19 DIAGNOSIS — N289 Disorder of kidney and ureter, unspecified: Secondary | ICD-10-CM | POA: Diagnosis not present

## 2016-02-19 DIAGNOSIS — I4891 Unspecified atrial fibrillation: Secondary | ICD-10-CM | POA: Diagnosis not present

## 2016-02-19 DIAGNOSIS — R0602 Shortness of breath: Secondary | ICD-10-CM | POA: Diagnosis not present

## 2016-02-19 DIAGNOSIS — K922 Gastrointestinal hemorrhage, unspecified: Secondary | ICD-10-CM | POA: Diagnosis not present

## 2016-02-19 DIAGNOSIS — I509 Heart failure, unspecified: Secondary | ICD-10-CM | POA: Diagnosis not present

## 2016-02-19 DIAGNOSIS — Z6820 Body mass index (BMI) 20.0-20.9, adult: Secondary | ICD-10-CM | POA: Diagnosis not present

## 2016-02-24 DIAGNOSIS — R2681 Unsteadiness on feet: Secondary | ICD-10-CM | POA: Diagnosis not present

## 2016-02-24 DIAGNOSIS — I482 Chronic atrial fibrillation: Secondary | ICD-10-CM | POA: Diagnosis not present

## 2016-02-24 DIAGNOSIS — I10 Essential (primary) hypertension: Secondary | ICD-10-CM | POA: Diagnosis not present

## 2016-02-24 DIAGNOSIS — I4891 Unspecified atrial fibrillation: Secondary | ICD-10-CM | POA: Diagnosis not present

## 2016-03-02 DIAGNOSIS — I482 Chronic atrial fibrillation: Secondary | ICD-10-CM | POA: Diagnosis not present

## 2016-03-02 DIAGNOSIS — I10 Essential (primary) hypertension: Secondary | ICD-10-CM | POA: Diagnosis not present

## 2016-03-02 DIAGNOSIS — R2681 Unsteadiness on feet: Secondary | ICD-10-CM | POA: Diagnosis not present

## 2016-03-02 DIAGNOSIS — I4891 Unspecified atrial fibrillation: Secondary | ICD-10-CM | POA: Diagnosis not present

## 2016-03-04 DIAGNOSIS — Z1389 Encounter for screening for other disorder: Secondary | ICD-10-CM | POA: Diagnosis not present

## 2016-03-04 DIAGNOSIS — G4700 Insomnia, unspecified: Secondary | ICD-10-CM | POA: Diagnosis not present

## 2016-03-04 DIAGNOSIS — E538 Deficiency of other specified B group vitamins: Secondary | ICD-10-CM | POA: Diagnosis not present

## 2016-03-04 DIAGNOSIS — N289 Disorder of kidney and ureter, unspecified: Secondary | ICD-10-CM | POA: Diagnosis not present

## 2016-03-04 DIAGNOSIS — E119 Type 2 diabetes mellitus without complications: Secondary | ICD-10-CM | POA: Diagnosis not present

## 2016-03-04 DIAGNOSIS — K922 Gastrointestinal hemorrhage, unspecified: Secondary | ICD-10-CM | POA: Diagnosis not present

## 2016-03-04 DIAGNOSIS — I509 Heart failure, unspecified: Secondary | ICD-10-CM | POA: Diagnosis not present

## 2016-03-04 DIAGNOSIS — I4891 Unspecified atrial fibrillation: Secondary | ICD-10-CM | POA: Diagnosis not present

## 2016-03-04 DIAGNOSIS — E559 Vitamin D deficiency, unspecified: Secondary | ICD-10-CM | POA: Diagnosis not present

## 2016-03-08 DIAGNOSIS — K922 Gastrointestinal hemorrhage, unspecified: Secondary | ICD-10-CM | POA: Diagnosis not present

## 2016-03-08 DIAGNOSIS — E559 Vitamin D deficiency, unspecified: Secondary | ICD-10-CM | POA: Diagnosis not present

## 2016-03-08 DIAGNOSIS — E538 Deficiency of other specified B group vitamins: Secondary | ICD-10-CM | POA: Diagnosis not present

## 2016-03-08 DIAGNOSIS — I4891 Unspecified atrial fibrillation: Secondary | ICD-10-CM | POA: Diagnosis not present

## 2016-03-08 DIAGNOSIS — E119 Type 2 diabetes mellitus without complications: Secondary | ICD-10-CM | POA: Diagnosis not present

## 2016-03-08 DIAGNOSIS — R0602 Shortness of breath: Secondary | ICD-10-CM | POA: Diagnosis not present

## 2016-03-08 DIAGNOSIS — G4700 Insomnia, unspecified: Secondary | ICD-10-CM | POA: Diagnosis not present

## 2016-03-08 DIAGNOSIS — N289 Disorder of kidney and ureter, unspecified: Secondary | ICD-10-CM | POA: Diagnosis not present

## 2016-03-08 DIAGNOSIS — I509 Heart failure, unspecified: Secondary | ICD-10-CM | POA: Diagnosis not present

## 2016-03-10 DIAGNOSIS — E559 Vitamin D deficiency, unspecified: Secondary | ICD-10-CM | POA: Diagnosis not present

## 2016-03-10 DIAGNOSIS — E119 Type 2 diabetes mellitus without complications: Secondary | ICD-10-CM | POA: Diagnosis not present

## 2016-03-10 DIAGNOSIS — I509 Heart failure, unspecified: Secondary | ICD-10-CM | POA: Diagnosis not present

## 2016-03-10 DIAGNOSIS — I10 Essential (primary) hypertension: Secondary | ICD-10-CM | POA: Diagnosis not present

## 2016-03-10 DIAGNOSIS — N184 Chronic kidney disease, stage 4 (severe): Secondary | ICD-10-CM | POA: Diagnosis not present

## 2016-03-10 DIAGNOSIS — Z681 Body mass index (BMI) 19 or less, adult: Secondary | ICD-10-CM | POA: Diagnosis not present

## 2016-03-10 DIAGNOSIS — M109 Gout, unspecified: Secondary | ICD-10-CM | POA: Diagnosis not present

## 2016-03-10 DIAGNOSIS — I4891 Unspecified atrial fibrillation: Secondary | ICD-10-CM | POA: Diagnosis not present

## 2016-03-11 DIAGNOSIS — I129 Hypertensive chronic kidney disease with stage 1 through stage 4 chronic kidney disease, or unspecified chronic kidney disease: Secondary | ICD-10-CM | POA: Diagnosis not present

## 2016-03-11 DIAGNOSIS — R531 Weakness: Secondary | ICD-10-CM | POA: Diagnosis not present

## 2016-03-11 DIAGNOSIS — E860 Dehydration: Secondary | ICD-10-CM | POA: Diagnosis not present

## 2016-03-11 DIAGNOSIS — N189 Chronic kidney disease, unspecified: Secondary | ICD-10-CM | POA: Diagnosis not present

## 2016-03-25 DIAGNOSIS — D508 Other iron deficiency anemias: Secondary | ICD-10-CM | POA: Diagnosis not present

## 2016-03-25 DIAGNOSIS — M109 Gout, unspecified: Secondary | ICD-10-CM | POA: Diagnosis not present

## 2016-03-25 DIAGNOSIS — E538 Deficiency of other specified B group vitamins: Secondary | ICD-10-CM | POA: Diagnosis not present

## 2016-03-25 DIAGNOSIS — E119 Type 2 diabetes mellitus without complications: Secondary | ICD-10-CM | POA: Diagnosis not present

## 2016-03-25 DIAGNOSIS — I4891 Unspecified atrial fibrillation: Secondary | ICD-10-CM | POA: Diagnosis not present

## 2016-03-25 DIAGNOSIS — I509 Heart failure, unspecified: Secondary | ICD-10-CM | POA: Diagnosis not present

## 2016-03-25 DIAGNOSIS — K922 Gastrointestinal hemorrhage, unspecified: Secondary | ICD-10-CM | POA: Diagnosis not present

## 2016-03-25 DIAGNOSIS — N183 Chronic kidney disease, stage 3 (moderate): Secondary | ICD-10-CM | POA: Diagnosis not present

## 2016-03-25 DIAGNOSIS — E559 Vitamin D deficiency, unspecified: Secondary | ICD-10-CM | POA: Diagnosis not present

## 2016-03-25 DIAGNOSIS — Z681 Body mass index (BMI) 19 or less, adult: Secondary | ICD-10-CM | POA: Diagnosis not present

## 2016-04-05 DIAGNOSIS — G4700 Insomnia, unspecified: Secondary | ICD-10-CM | POA: Diagnosis not present

## 2016-04-05 DIAGNOSIS — R300 Dysuria: Secondary | ICD-10-CM | POA: Diagnosis not present

## 2016-04-05 DIAGNOSIS — N183 Chronic kidney disease, stage 3 (moderate): Secondary | ICD-10-CM | POA: Diagnosis not present

## 2016-04-05 DIAGNOSIS — Z6820 Body mass index (BMI) 20.0-20.9, adult: Secondary | ICD-10-CM | POA: Diagnosis not present

## 2016-04-05 DIAGNOSIS — E119 Type 2 diabetes mellitus without complications: Secondary | ICD-10-CM | POA: Diagnosis not present

## 2016-04-05 DIAGNOSIS — E538 Deficiency of other specified B group vitamins: Secondary | ICD-10-CM | POA: Diagnosis not present

## 2016-04-26 DIAGNOSIS — E559 Vitamin D deficiency, unspecified: Secondary | ICD-10-CM | POA: Diagnosis not present

## 2016-04-26 DIAGNOSIS — G4700 Insomnia, unspecified: Secondary | ICD-10-CM | POA: Diagnosis not present

## 2016-04-26 DIAGNOSIS — E538 Deficiency of other specified B group vitamins: Secondary | ICD-10-CM | POA: Diagnosis not present

## 2016-04-26 DIAGNOSIS — E119 Type 2 diabetes mellitus without complications: Secondary | ICD-10-CM | POA: Diagnosis not present

## 2016-04-26 DIAGNOSIS — N183 Chronic kidney disease, stage 3 (moderate): Secondary | ICD-10-CM | POA: Diagnosis not present

## 2016-04-26 DIAGNOSIS — I509 Heart failure, unspecified: Secondary | ICD-10-CM | POA: Diagnosis not present

## 2016-04-26 DIAGNOSIS — Z681 Body mass index (BMI) 19 or less, adult: Secondary | ICD-10-CM | POA: Diagnosis not present

## 2016-04-26 DIAGNOSIS — K922 Gastrointestinal hemorrhage, unspecified: Secondary | ICD-10-CM | POA: Diagnosis not present

## 2016-04-26 DIAGNOSIS — R0602 Shortness of breath: Secondary | ICD-10-CM | POA: Diagnosis not present

## 2016-04-27 DIAGNOSIS — H25813 Combined forms of age-related cataract, bilateral: Secondary | ICD-10-CM | POA: Diagnosis not present

## 2016-04-27 DIAGNOSIS — H52209 Unspecified astigmatism, unspecified eye: Secondary | ICD-10-CM | POA: Diagnosis not present

## 2016-04-27 DIAGNOSIS — H524 Presbyopia: Secondary | ICD-10-CM | POA: Diagnosis not present

## 2016-04-27 DIAGNOSIS — H5203 Hypermetropia, bilateral: Secondary | ICD-10-CM | POA: Diagnosis not present

## 2016-04-27 DIAGNOSIS — H5200 Hypermetropia, unspecified eye: Secondary | ICD-10-CM | POA: Diagnosis not present

## 2016-05-27 DIAGNOSIS — E119 Type 2 diabetes mellitus without complications: Secondary | ICD-10-CM | POA: Diagnosis not present

## 2016-05-27 DIAGNOSIS — E559 Vitamin D deficiency, unspecified: Secondary | ICD-10-CM | POA: Diagnosis not present

## 2016-05-27 DIAGNOSIS — M109 Gout, unspecified: Secondary | ICD-10-CM | POA: Diagnosis not present

## 2016-05-27 DIAGNOSIS — Z681 Body mass index (BMI) 19 or less, adult: Secondary | ICD-10-CM | POA: Diagnosis not present

## 2016-05-27 DIAGNOSIS — I4891 Unspecified atrial fibrillation: Secondary | ICD-10-CM | POA: Diagnosis not present

## 2016-05-27 DIAGNOSIS — G4700 Insomnia, unspecified: Secondary | ICD-10-CM | POA: Diagnosis not present

## 2016-05-27 DIAGNOSIS — I509 Heart failure, unspecified: Secondary | ICD-10-CM | POA: Diagnosis not present

## 2016-05-27 DIAGNOSIS — K922 Gastrointestinal hemorrhage, unspecified: Secondary | ICD-10-CM | POA: Diagnosis not present

## 2016-05-27 DIAGNOSIS — N183 Chronic kidney disease, stage 3 (moderate): Secondary | ICD-10-CM | POA: Diagnosis not present

## 2016-06-01 DIAGNOSIS — I10 Essential (primary) hypertension: Secondary | ICD-10-CM | POA: Diagnosis not present

## 2016-06-01 DIAGNOSIS — I482 Chronic atrial fibrillation: Secondary | ICD-10-CM | POA: Diagnosis not present

## 2016-06-24 DIAGNOSIS — N183 Chronic kidney disease, stage 3 (moderate): Secondary | ICD-10-CM | POA: Diagnosis not present

## 2016-06-24 DIAGNOSIS — I4891 Unspecified atrial fibrillation: Secondary | ICD-10-CM | POA: Diagnosis not present

## 2016-06-24 DIAGNOSIS — Z6820 Body mass index (BMI) 20.0-20.9, adult: Secondary | ICD-10-CM | POA: Diagnosis not present

## 2016-06-24 DIAGNOSIS — K922 Gastrointestinal hemorrhage, unspecified: Secondary | ICD-10-CM | POA: Diagnosis not present

## 2016-06-24 DIAGNOSIS — I10 Essential (primary) hypertension: Secondary | ICD-10-CM | POA: Diagnosis not present

## 2016-06-24 DIAGNOSIS — G4700 Insomnia, unspecified: Secondary | ICD-10-CM | POA: Diagnosis not present

## 2016-06-24 DIAGNOSIS — D649 Anemia, unspecified: Secondary | ICD-10-CM | POA: Diagnosis not present

## 2016-06-24 DIAGNOSIS — E119 Type 2 diabetes mellitus without complications: Secondary | ICD-10-CM | POA: Diagnosis not present

## 2016-06-24 DIAGNOSIS — M109 Gout, unspecified: Secondary | ICD-10-CM | POA: Diagnosis not present

## 2016-06-24 DIAGNOSIS — I509 Heart failure, unspecified: Secondary | ICD-10-CM | POA: Diagnosis not present

## 2016-07-08 DIAGNOSIS — N183 Chronic kidney disease, stage 3 (moderate): Secondary | ICD-10-CM | POA: Diagnosis not present

## 2016-07-08 DIAGNOSIS — E782 Mixed hyperlipidemia: Secondary | ICD-10-CM | POA: Diagnosis not present

## 2016-07-08 DIAGNOSIS — E119 Type 2 diabetes mellitus without complications: Secondary | ICD-10-CM | POA: Diagnosis not present

## 2016-07-08 DIAGNOSIS — E538 Deficiency of other specified B group vitamins: Secondary | ICD-10-CM | POA: Diagnosis not present

## 2016-07-08 DIAGNOSIS — Z6820 Body mass index (BMI) 20.0-20.9, adult: Secondary | ICD-10-CM | POA: Diagnosis not present

## 2016-07-08 DIAGNOSIS — D649 Anemia, unspecified: Secondary | ICD-10-CM | POA: Diagnosis not present

## 2016-07-08 DIAGNOSIS — E559 Vitamin D deficiency, unspecified: Secondary | ICD-10-CM | POA: Diagnosis not present

## 2016-07-08 DIAGNOSIS — M109 Gout, unspecified: Secondary | ICD-10-CM | POA: Diagnosis not present

## 2016-07-08 DIAGNOSIS — Z79899 Other long term (current) drug therapy: Secondary | ICD-10-CM | POA: Diagnosis not present

## 2016-08-03 DIAGNOSIS — I509 Heart failure, unspecified: Secondary | ICD-10-CM | POA: Diagnosis not present

## 2016-08-03 DIAGNOSIS — I517 Cardiomegaly: Secondary | ICD-10-CM | POA: Diagnosis not present

## 2016-08-03 DIAGNOSIS — I482 Chronic atrial fibrillation: Secondary | ICD-10-CM | POA: Diagnosis not present

## 2016-08-09 DIAGNOSIS — N183 Chronic kidney disease, stage 3 (moderate): Secondary | ICD-10-CM | POA: Diagnosis not present

## 2016-08-09 DIAGNOSIS — I4891 Unspecified atrial fibrillation: Secondary | ICD-10-CM | POA: Diagnosis not present

## 2016-08-09 DIAGNOSIS — E063 Autoimmune thyroiditis: Secondary | ICD-10-CM | POA: Diagnosis not present

## 2016-08-09 DIAGNOSIS — G4700 Insomnia, unspecified: Secondary | ICD-10-CM | POA: Diagnosis not present

## 2016-08-09 DIAGNOSIS — K922 Gastrointestinal hemorrhage, unspecified: Secondary | ICD-10-CM | POA: Diagnosis not present

## 2016-08-09 DIAGNOSIS — M109 Gout, unspecified: Secondary | ICD-10-CM | POA: Diagnosis not present

## 2016-08-09 DIAGNOSIS — I509 Heart failure, unspecified: Secondary | ICD-10-CM | POA: Diagnosis not present

## 2016-08-09 DIAGNOSIS — E119 Type 2 diabetes mellitus without complications: Secondary | ICD-10-CM | POA: Diagnosis not present

## 2016-09-08 DIAGNOSIS — K922 Gastrointestinal hemorrhage, unspecified: Secondary | ICD-10-CM | POA: Diagnosis not present

## 2016-09-08 DIAGNOSIS — E538 Deficiency of other specified B group vitamins: Secondary | ICD-10-CM | POA: Diagnosis not present

## 2016-09-08 DIAGNOSIS — E559 Vitamin D deficiency, unspecified: Secondary | ICD-10-CM | POA: Diagnosis not present

## 2016-09-08 DIAGNOSIS — I509 Heart failure, unspecified: Secondary | ICD-10-CM | POA: Diagnosis not present

## 2016-09-08 DIAGNOSIS — I4891 Unspecified atrial fibrillation: Secondary | ICD-10-CM | POA: Diagnosis not present

## 2016-09-08 DIAGNOSIS — N183 Chronic kidney disease, stage 3 (moderate): Secondary | ICD-10-CM | POA: Diagnosis not present

## 2016-09-08 DIAGNOSIS — G4700 Insomnia, unspecified: Secondary | ICD-10-CM | POA: Diagnosis not present

## 2016-09-08 DIAGNOSIS — M109 Gout, unspecified: Secondary | ICD-10-CM | POA: Diagnosis not present

## 2016-09-08 DIAGNOSIS — E119 Type 2 diabetes mellitus without complications: Secondary | ICD-10-CM | POA: Diagnosis not present

## 2016-09-16 DIAGNOSIS — M109 Gout, unspecified: Secondary | ICD-10-CM | POA: Diagnosis not present

## 2016-09-16 DIAGNOSIS — K922 Gastrointestinal hemorrhage, unspecified: Secondary | ICD-10-CM | POA: Diagnosis not present

## 2016-09-16 DIAGNOSIS — Z6821 Body mass index (BMI) 21.0-21.9, adult: Secondary | ICD-10-CM | POA: Diagnosis not present

## 2016-09-16 DIAGNOSIS — E063 Autoimmune thyroiditis: Secondary | ICD-10-CM | POA: Diagnosis not present

## 2016-09-16 DIAGNOSIS — N183 Chronic kidney disease, stage 3 (moderate): Secondary | ICD-10-CM | POA: Diagnosis not present

## 2016-09-16 DIAGNOSIS — I4891 Unspecified atrial fibrillation: Secondary | ICD-10-CM | POA: Diagnosis not present

## 2016-09-16 DIAGNOSIS — E559 Vitamin D deficiency, unspecified: Secondary | ICD-10-CM | POA: Diagnosis not present

## 2016-09-16 DIAGNOSIS — G4700 Insomnia, unspecified: Secondary | ICD-10-CM | POA: Diagnosis not present

## 2016-09-16 DIAGNOSIS — I509 Heart failure, unspecified: Secondary | ICD-10-CM | POA: Diagnosis not present

## 2016-10-04 DIAGNOSIS — I4891 Unspecified atrial fibrillation: Secondary | ICD-10-CM | POA: Diagnosis not present

## 2016-10-04 DIAGNOSIS — D649 Anemia, unspecified: Secondary | ICD-10-CM | POA: Diagnosis not present

## 2016-10-04 DIAGNOSIS — K922 Gastrointestinal hemorrhage, unspecified: Secondary | ICD-10-CM | POA: Diagnosis not present

## 2016-10-04 DIAGNOSIS — Z79899 Other long term (current) drug therapy: Secondary | ICD-10-CM | POA: Diagnosis not present

## 2016-10-04 DIAGNOSIS — N183 Chronic kidney disease, stage 3 (moderate): Secondary | ICD-10-CM | POA: Diagnosis not present

## 2016-10-05 DIAGNOSIS — D649 Anemia, unspecified: Secondary | ICD-10-CM | POA: Diagnosis not present

## 2016-10-06 DIAGNOSIS — K259 Gastric ulcer, unspecified as acute or chronic, without hemorrhage or perforation: Secondary | ICD-10-CM | POA: Diagnosis not present

## 2016-10-06 DIAGNOSIS — D62 Acute posthemorrhagic anemia: Secondary | ICD-10-CM | POA: Diagnosis not present

## 2016-10-06 DIAGNOSIS — D500 Iron deficiency anemia secondary to blood loss (chronic): Secondary | ICD-10-CM | POA: Diagnosis not present

## 2016-10-07 DIAGNOSIS — Z86010 Personal history of colonic polyps: Secondary | ICD-10-CM | POA: Diagnosis not present

## 2016-10-07 DIAGNOSIS — D509 Iron deficiency anemia, unspecified: Secondary | ICD-10-CM | POA: Diagnosis not present

## 2016-10-07 DIAGNOSIS — Z79899 Other long term (current) drug therapy: Secondary | ICD-10-CM | POA: Diagnosis not present

## 2016-10-07 DIAGNOSIS — D649 Anemia, unspecified: Secondary | ICD-10-CM | POA: Diagnosis not present

## 2016-10-07 DIAGNOSIS — K449 Diaphragmatic hernia without obstruction or gangrene: Secondary | ICD-10-CM | POA: Diagnosis not present

## 2016-10-07 DIAGNOSIS — Z90710 Acquired absence of both cervix and uterus: Secondary | ICD-10-CM | POA: Diagnosis not present

## 2016-10-07 DIAGNOSIS — K222 Esophageal obstruction: Secondary | ICD-10-CM | POA: Diagnosis not present

## 2016-10-07 DIAGNOSIS — I252 Old myocardial infarction: Secondary | ICD-10-CM | POA: Diagnosis not present

## 2016-10-07 DIAGNOSIS — Q2733 Arteriovenous malformation of digestive system vessel: Secondary | ICD-10-CM | POA: Diagnosis not present

## 2016-10-07 DIAGNOSIS — I10 Essential (primary) hypertension: Secondary | ICD-10-CM | POA: Diagnosis not present

## 2016-10-11 DIAGNOSIS — I509 Heart failure, unspecified: Secondary | ICD-10-CM | POA: Diagnosis not present

## 2016-10-11 DIAGNOSIS — G4700 Insomnia, unspecified: Secondary | ICD-10-CM | POA: Diagnosis not present

## 2016-10-11 DIAGNOSIS — Z79899 Other long term (current) drug therapy: Secondary | ICD-10-CM | POA: Diagnosis not present

## 2016-10-11 DIAGNOSIS — N183 Chronic kidney disease, stage 3 (moderate): Secondary | ICD-10-CM | POA: Diagnosis not present

## 2016-10-11 DIAGNOSIS — I4891 Unspecified atrial fibrillation: Secondary | ICD-10-CM | POA: Diagnosis not present

## 2016-10-11 DIAGNOSIS — E559 Vitamin D deficiency, unspecified: Secondary | ICD-10-CM | POA: Diagnosis not present

## 2016-10-11 DIAGNOSIS — M109 Gout, unspecified: Secondary | ICD-10-CM | POA: Diagnosis not present

## 2016-10-11 DIAGNOSIS — K922 Gastrointestinal hemorrhage, unspecified: Secondary | ICD-10-CM | POA: Diagnosis not present

## 2016-10-11 DIAGNOSIS — D649 Anemia, unspecified: Secondary | ICD-10-CM | POA: Diagnosis not present

## 2016-10-14 DIAGNOSIS — D649 Anemia, unspecified: Secondary | ICD-10-CM | POA: Diagnosis not present

## 2016-11-29 DIAGNOSIS — K250 Acute gastric ulcer with hemorrhage: Secondary | ICD-10-CM | POA: Diagnosis not present

## 2016-11-29 DIAGNOSIS — D62 Acute posthemorrhagic anemia: Secondary | ICD-10-CM | POA: Diagnosis not present

## 2016-11-29 DIAGNOSIS — D509 Iron deficiency anemia, unspecified: Secondary | ICD-10-CM | POA: Diagnosis not present

## 2016-12-03 DIAGNOSIS — D509 Iron deficiency anemia, unspecified: Secondary | ICD-10-CM | POA: Diagnosis not present

## 2016-12-03 DIAGNOSIS — G4700 Insomnia, unspecified: Secondary | ICD-10-CM | POA: Diagnosis not present

## 2016-12-03 DIAGNOSIS — E559 Vitamin D deficiency, unspecified: Secondary | ICD-10-CM | POA: Diagnosis not present

## 2016-12-03 DIAGNOSIS — I509 Heart failure, unspecified: Secondary | ICD-10-CM | POA: Diagnosis not present

## 2016-12-03 DIAGNOSIS — E063 Autoimmune thyroiditis: Secondary | ICD-10-CM | POA: Diagnosis not present

## 2016-12-03 DIAGNOSIS — E538 Deficiency of other specified B group vitamins: Secondary | ICD-10-CM | POA: Diagnosis not present

## 2016-12-03 DIAGNOSIS — D649 Anemia, unspecified: Secondary | ICD-10-CM | POA: Diagnosis not present

## 2016-12-03 DIAGNOSIS — Z6821 Body mass index (BMI) 21.0-21.9, adult: Secondary | ICD-10-CM | POA: Diagnosis not present

## 2016-12-03 DIAGNOSIS — K922 Gastrointestinal hemorrhage, unspecified: Secondary | ICD-10-CM | POA: Diagnosis not present

## 2016-12-03 DIAGNOSIS — N183 Chronic kidney disease, stage 3 (moderate): Secondary | ICD-10-CM | POA: Diagnosis not present

## 2016-12-03 DIAGNOSIS — Q2733 Arteriovenous malformation of digestive system vessel: Secondary | ICD-10-CM | POA: Diagnosis not present

## 2016-12-10 DIAGNOSIS — D649 Anemia, unspecified: Secondary | ICD-10-CM | POA: Diagnosis not present

## 2016-12-17 DIAGNOSIS — D649 Anemia, unspecified: Secondary | ICD-10-CM | POA: Diagnosis not present

## 2017-01-10 DIAGNOSIS — Z6820 Body mass index (BMI) 20.0-20.9, adult: Secondary | ICD-10-CM | POA: Diagnosis not present

## 2017-01-10 DIAGNOSIS — R300 Dysuria: Secondary | ICD-10-CM | POA: Diagnosis not present

## 2017-01-10 DIAGNOSIS — N390 Urinary tract infection, site not specified: Secondary | ICD-10-CM | POA: Diagnosis not present

## 2017-01-12 DIAGNOSIS — S0181XA Laceration without foreign body of other part of head, initial encounter: Secondary | ICD-10-CM | POA: Diagnosis not present

## 2017-01-12 DIAGNOSIS — S0990XA Unspecified injury of head, initial encounter: Secondary | ICD-10-CM | POA: Diagnosis not present

## 2017-01-12 DIAGNOSIS — Z23 Encounter for immunization: Secondary | ICD-10-CM | POA: Diagnosis not present

## 2017-01-12 DIAGNOSIS — S199XXA Unspecified injury of neck, initial encounter: Secondary | ICD-10-CM | POA: Diagnosis not present

## 2017-01-12 DIAGNOSIS — S098XXA Other specified injuries of head, initial encounter: Secondary | ICD-10-CM | POA: Diagnosis not present

## 2017-01-12 DIAGNOSIS — S81812A Laceration without foreign body, left lower leg, initial encounter: Secondary | ICD-10-CM | POA: Diagnosis not present

## 2017-01-12 DIAGNOSIS — R51 Headache: Secondary | ICD-10-CM | POA: Diagnosis not present

## 2017-01-14 DIAGNOSIS — E538 Deficiency of other specified B group vitamins: Secondary | ICD-10-CM | POA: Diagnosis not present

## 2017-01-14 DIAGNOSIS — I509 Heart failure, unspecified: Secondary | ICD-10-CM | POA: Diagnosis not present

## 2017-01-14 DIAGNOSIS — I4891 Unspecified atrial fibrillation: Secondary | ICD-10-CM | POA: Diagnosis not present

## 2017-01-14 DIAGNOSIS — N390 Urinary tract infection, site not specified: Secondary | ICD-10-CM | POA: Diagnosis not present

## 2017-01-14 DIAGNOSIS — E063 Autoimmune thyroiditis: Secondary | ICD-10-CM | POA: Diagnosis not present

## 2017-01-14 DIAGNOSIS — E559 Vitamin D deficiency, unspecified: Secondary | ICD-10-CM | POA: Diagnosis not present

## 2017-01-14 DIAGNOSIS — K922 Gastrointestinal hemorrhage, unspecified: Secondary | ICD-10-CM | POA: Diagnosis not present

## 2017-01-14 DIAGNOSIS — N183 Chronic kidney disease, stage 3 (moderate): Secondary | ICD-10-CM | POA: Diagnosis not present

## 2017-01-14 DIAGNOSIS — D649 Anemia, unspecified: Secondary | ICD-10-CM | POA: Diagnosis not present

## 2017-01-16 DIAGNOSIS — E119 Type 2 diabetes mellitus without complications: Secondary | ICD-10-CM | POA: Diagnosis not present

## 2017-01-16 DIAGNOSIS — R296 Repeated falls: Secondary | ICD-10-CM | POA: Diagnosis not present

## 2017-01-16 DIAGNOSIS — N390 Urinary tract infection, site not specified: Secondary | ICD-10-CM | POA: Diagnosis not present

## 2017-01-16 DIAGNOSIS — Q2733 Arteriovenous malformation of digestive system vessel: Secondary | ICD-10-CM | POA: Diagnosis not present

## 2017-01-16 DIAGNOSIS — Z79899 Other long term (current) drug therapy: Secondary | ICD-10-CM | POA: Diagnosis not present

## 2017-01-16 DIAGNOSIS — J449 Chronic obstructive pulmonary disease, unspecified: Secondary | ICD-10-CM | POA: Diagnosis not present

## 2017-01-16 DIAGNOSIS — I509 Heart failure, unspecified: Secondary | ICD-10-CM | POA: Diagnosis not present

## 2017-01-16 DIAGNOSIS — I10 Essential (primary) hypertension: Secondary | ICD-10-CM | POA: Diagnosis not present

## 2017-01-16 DIAGNOSIS — E782 Mixed hyperlipidemia: Secondary | ICD-10-CM | POA: Diagnosis not present

## 2017-01-19 DIAGNOSIS — R296 Repeated falls: Secondary | ICD-10-CM | POA: Diagnosis not present

## 2017-01-19 DIAGNOSIS — Z79899 Other long term (current) drug therapy: Secondary | ICD-10-CM | POA: Diagnosis not present

## 2017-01-19 DIAGNOSIS — J449 Chronic obstructive pulmonary disease, unspecified: Secondary | ICD-10-CM | POA: Diagnosis not present

## 2017-01-19 DIAGNOSIS — E782 Mixed hyperlipidemia: Secondary | ICD-10-CM | POA: Diagnosis not present

## 2017-01-19 DIAGNOSIS — N390 Urinary tract infection, site not specified: Secondary | ICD-10-CM | POA: Diagnosis not present

## 2017-01-19 DIAGNOSIS — E119 Type 2 diabetes mellitus without complications: Secondary | ICD-10-CM | POA: Diagnosis not present

## 2017-01-19 DIAGNOSIS — I10 Essential (primary) hypertension: Secondary | ICD-10-CM | POA: Diagnosis not present

## 2017-01-19 DIAGNOSIS — I509 Heart failure, unspecified: Secondary | ICD-10-CM | POA: Diagnosis not present

## 2017-01-19 DIAGNOSIS — Q2733 Arteriovenous malformation of digestive system vessel: Secondary | ICD-10-CM | POA: Diagnosis not present

## 2017-01-21 DIAGNOSIS — R296 Repeated falls: Secondary | ICD-10-CM | POA: Diagnosis not present

## 2017-01-21 DIAGNOSIS — N390 Urinary tract infection, site not specified: Secondary | ICD-10-CM | POA: Diagnosis not present

## 2017-01-21 DIAGNOSIS — Z79899 Other long term (current) drug therapy: Secondary | ICD-10-CM | POA: Diagnosis not present

## 2017-01-21 DIAGNOSIS — I509 Heart failure, unspecified: Secondary | ICD-10-CM | POA: Diagnosis not present

## 2017-01-21 DIAGNOSIS — J449 Chronic obstructive pulmonary disease, unspecified: Secondary | ICD-10-CM | POA: Diagnosis not present

## 2017-01-21 DIAGNOSIS — I10 Essential (primary) hypertension: Secondary | ICD-10-CM | POA: Diagnosis not present

## 2017-01-21 DIAGNOSIS — E119 Type 2 diabetes mellitus without complications: Secondary | ICD-10-CM | POA: Diagnosis not present

## 2017-01-21 DIAGNOSIS — Q2733 Arteriovenous malformation of digestive system vessel: Secondary | ICD-10-CM | POA: Diagnosis not present

## 2017-01-21 DIAGNOSIS — E782 Mixed hyperlipidemia: Secondary | ICD-10-CM | POA: Diagnosis not present

## 2017-01-25 DIAGNOSIS — I10 Essential (primary) hypertension: Secondary | ICD-10-CM | POA: Diagnosis not present

## 2017-01-25 DIAGNOSIS — J449 Chronic obstructive pulmonary disease, unspecified: Secondary | ICD-10-CM | POA: Diagnosis not present

## 2017-01-25 DIAGNOSIS — R296 Repeated falls: Secondary | ICD-10-CM | POA: Diagnosis not present

## 2017-01-25 DIAGNOSIS — Q2733 Arteriovenous malformation of digestive system vessel: Secondary | ICD-10-CM | POA: Diagnosis not present

## 2017-01-25 DIAGNOSIS — N390 Urinary tract infection, site not specified: Secondary | ICD-10-CM | POA: Diagnosis not present

## 2017-01-25 DIAGNOSIS — E782 Mixed hyperlipidemia: Secondary | ICD-10-CM | POA: Diagnosis not present

## 2017-01-25 DIAGNOSIS — E119 Type 2 diabetes mellitus without complications: Secondary | ICD-10-CM | POA: Diagnosis not present

## 2017-01-25 DIAGNOSIS — I509 Heart failure, unspecified: Secondary | ICD-10-CM | POA: Diagnosis not present

## 2017-01-25 DIAGNOSIS — Z79899 Other long term (current) drug therapy: Secondary | ICD-10-CM | POA: Diagnosis not present

## 2017-01-27 DIAGNOSIS — I10 Essential (primary) hypertension: Secondary | ICD-10-CM | POA: Diagnosis not present

## 2017-01-27 DIAGNOSIS — Z79899 Other long term (current) drug therapy: Secondary | ICD-10-CM | POA: Diagnosis not present

## 2017-01-27 DIAGNOSIS — J449 Chronic obstructive pulmonary disease, unspecified: Secondary | ICD-10-CM | POA: Diagnosis not present

## 2017-01-27 DIAGNOSIS — R296 Repeated falls: Secondary | ICD-10-CM | POA: Diagnosis not present

## 2017-01-27 DIAGNOSIS — E782 Mixed hyperlipidemia: Secondary | ICD-10-CM | POA: Diagnosis not present

## 2017-01-27 DIAGNOSIS — I509 Heart failure, unspecified: Secondary | ICD-10-CM | POA: Diagnosis not present

## 2017-01-27 DIAGNOSIS — E119 Type 2 diabetes mellitus without complications: Secondary | ICD-10-CM | POA: Diagnosis not present

## 2017-01-27 DIAGNOSIS — Q2733 Arteriovenous malformation of digestive system vessel: Secondary | ICD-10-CM | POA: Diagnosis not present

## 2017-01-27 DIAGNOSIS — N390 Urinary tract infection, site not specified: Secondary | ICD-10-CM | POA: Diagnosis not present

## 2017-01-28 DIAGNOSIS — I4891 Unspecified atrial fibrillation: Secondary | ICD-10-CM | POA: Diagnosis not present

## 2017-01-28 DIAGNOSIS — N183 Chronic kidney disease, stage 3 (moderate): Secondary | ICD-10-CM | POA: Diagnosis not present

## 2017-01-28 DIAGNOSIS — I509 Heart failure, unspecified: Secondary | ICD-10-CM | POA: Diagnosis not present

## 2017-01-28 DIAGNOSIS — E063 Autoimmune thyroiditis: Secondary | ICD-10-CM | POA: Diagnosis not present

## 2017-01-28 DIAGNOSIS — Z1389 Encounter for screening for other disorder: Secondary | ICD-10-CM | POA: Diagnosis not present

## 2017-01-28 DIAGNOSIS — E559 Vitamin D deficiency, unspecified: Secondary | ICD-10-CM | POA: Diagnosis not present

## 2017-01-28 DIAGNOSIS — G4700 Insomnia, unspecified: Secondary | ICD-10-CM | POA: Diagnosis not present

## 2017-01-28 DIAGNOSIS — Z9181 History of falling: Secondary | ICD-10-CM | POA: Diagnosis not present

## 2017-01-28 DIAGNOSIS — I10 Essential (primary) hypertension: Secondary | ICD-10-CM | POA: Diagnosis not present

## 2017-02-01 DIAGNOSIS — I509 Heart failure, unspecified: Secondary | ICD-10-CM | POA: Diagnosis not present

## 2017-02-01 DIAGNOSIS — R296 Repeated falls: Secondary | ICD-10-CM | POA: Diagnosis not present

## 2017-02-01 DIAGNOSIS — Q2733 Arteriovenous malformation of digestive system vessel: Secondary | ICD-10-CM | POA: Diagnosis not present

## 2017-02-01 DIAGNOSIS — Z79899 Other long term (current) drug therapy: Secondary | ICD-10-CM | POA: Diagnosis not present

## 2017-02-01 DIAGNOSIS — N390 Urinary tract infection, site not specified: Secondary | ICD-10-CM | POA: Diagnosis not present

## 2017-02-01 DIAGNOSIS — E782 Mixed hyperlipidemia: Secondary | ICD-10-CM | POA: Diagnosis not present

## 2017-02-01 DIAGNOSIS — E119 Type 2 diabetes mellitus without complications: Secondary | ICD-10-CM | POA: Diagnosis not present

## 2017-02-01 DIAGNOSIS — J449 Chronic obstructive pulmonary disease, unspecified: Secondary | ICD-10-CM | POA: Diagnosis not present

## 2017-02-01 DIAGNOSIS — I10 Essential (primary) hypertension: Secondary | ICD-10-CM | POA: Diagnosis not present

## 2017-02-03 DIAGNOSIS — R296 Repeated falls: Secondary | ICD-10-CM | POA: Diagnosis not present

## 2017-02-03 DIAGNOSIS — I10 Essential (primary) hypertension: Secondary | ICD-10-CM | POA: Diagnosis not present

## 2017-02-03 DIAGNOSIS — J449 Chronic obstructive pulmonary disease, unspecified: Secondary | ICD-10-CM | POA: Diagnosis not present

## 2017-02-03 DIAGNOSIS — Z79899 Other long term (current) drug therapy: Secondary | ICD-10-CM | POA: Diagnosis not present

## 2017-02-03 DIAGNOSIS — I509 Heart failure, unspecified: Secondary | ICD-10-CM | POA: Diagnosis not present

## 2017-02-03 DIAGNOSIS — Q2733 Arteriovenous malformation of digestive system vessel: Secondary | ICD-10-CM | POA: Diagnosis not present

## 2017-02-03 DIAGNOSIS — E119 Type 2 diabetes mellitus without complications: Secondary | ICD-10-CM | POA: Diagnosis not present

## 2017-02-03 DIAGNOSIS — N390 Urinary tract infection, site not specified: Secondary | ICD-10-CM | POA: Diagnosis not present

## 2017-02-03 DIAGNOSIS — E782 Mixed hyperlipidemia: Secondary | ICD-10-CM | POA: Diagnosis not present

## 2017-02-11 DIAGNOSIS — E782 Mixed hyperlipidemia: Secondary | ICD-10-CM | POA: Diagnosis not present

## 2017-02-11 DIAGNOSIS — R296 Repeated falls: Secondary | ICD-10-CM | POA: Diagnosis not present

## 2017-02-11 DIAGNOSIS — E119 Type 2 diabetes mellitus without complications: Secondary | ICD-10-CM | POA: Diagnosis not present

## 2017-02-11 DIAGNOSIS — I10 Essential (primary) hypertension: Secondary | ICD-10-CM | POA: Diagnosis not present

## 2017-02-11 DIAGNOSIS — I509 Heart failure, unspecified: Secondary | ICD-10-CM | POA: Diagnosis not present

## 2017-02-11 DIAGNOSIS — J449 Chronic obstructive pulmonary disease, unspecified: Secondary | ICD-10-CM | POA: Diagnosis not present

## 2017-02-11 DIAGNOSIS — Q2733 Arteriovenous malformation of digestive system vessel: Secondary | ICD-10-CM | POA: Diagnosis not present

## 2017-02-11 DIAGNOSIS — N390 Urinary tract infection, site not specified: Secondary | ICD-10-CM | POA: Diagnosis not present

## 2017-02-11 DIAGNOSIS — Z79899 Other long term (current) drug therapy: Secondary | ICD-10-CM | POA: Diagnosis not present

## 2017-02-14 DIAGNOSIS — E063 Autoimmune thyroiditis: Secondary | ICD-10-CM | POA: Diagnosis not present

## 2017-02-14 DIAGNOSIS — N183 Chronic kidney disease, stage 3 (moderate): Secondary | ICD-10-CM | POA: Diagnosis not present

## 2017-02-14 DIAGNOSIS — N390 Urinary tract infection, site not specified: Secondary | ICD-10-CM | POA: Diagnosis not present

## 2017-02-14 DIAGNOSIS — E119 Type 2 diabetes mellitus without complications: Secondary | ICD-10-CM | POA: Diagnosis not present

## 2017-02-14 DIAGNOSIS — R300 Dysuria: Secondary | ICD-10-CM | POA: Diagnosis not present

## 2017-02-14 DIAGNOSIS — I4891 Unspecified atrial fibrillation: Secondary | ICD-10-CM | POA: Diagnosis not present

## 2017-02-14 DIAGNOSIS — I509 Heart failure, unspecified: Secondary | ICD-10-CM | POA: Diagnosis not present

## 2017-02-14 DIAGNOSIS — I10 Essential (primary) hypertension: Secondary | ICD-10-CM | POA: Diagnosis not present

## 2017-02-14 DIAGNOSIS — S41111A Laceration without foreign body of right upper arm, initial encounter: Secondary | ICD-10-CM | POA: Diagnosis not present

## 2017-02-21 DIAGNOSIS — I110 Hypertensive heart disease with heart failure: Secondary | ICD-10-CM | POA: Diagnosis not present

## 2017-02-21 DIAGNOSIS — I4891 Unspecified atrial fibrillation: Secondary | ICD-10-CM | POA: Diagnosis not present

## 2017-02-21 DIAGNOSIS — S51811A Laceration without foreign body of right forearm, initial encounter: Secondary | ICD-10-CM | POA: Diagnosis not present

## 2017-02-21 DIAGNOSIS — Z87891 Personal history of nicotine dependence: Secondary | ICD-10-CM | POA: Diagnosis not present

## 2017-02-21 DIAGNOSIS — M109 Gout, unspecified: Secondary | ICD-10-CM | POA: Diagnosis not present

## 2017-02-21 DIAGNOSIS — I509 Heart failure, unspecified: Secondary | ICD-10-CM | POA: Diagnosis not present

## 2017-02-21 DIAGNOSIS — S41101A Unspecified open wound of right upper arm, initial encounter: Secondary | ICD-10-CM | POA: Diagnosis not present

## 2017-02-23 DIAGNOSIS — S51801A Unspecified open wound of right forearm, initial encounter: Secondary | ICD-10-CM | POA: Diagnosis not present

## 2017-02-23 DIAGNOSIS — S51811A Laceration without foreign body of right forearm, initial encounter: Secondary | ICD-10-CM | POA: Diagnosis not present

## 2017-02-25 DIAGNOSIS — N183 Chronic kidney disease, stage 3 (moderate): Secondary | ICD-10-CM | POA: Diagnosis not present

## 2017-02-25 DIAGNOSIS — E1122 Type 2 diabetes mellitus with diabetic chronic kidney disease: Secondary | ICD-10-CM | POA: Diagnosis not present

## 2017-02-25 DIAGNOSIS — I509 Heart failure, unspecified: Secondary | ICD-10-CM | POA: Diagnosis not present

## 2017-02-25 DIAGNOSIS — I4891 Unspecified atrial fibrillation: Secondary | ICD-10-CM | POA: Diagnosis not present

## 2017-02-25 DIAGNOSIS — D509 Iron deficiency anemia, unspecified: Secondary | ICD-10-CM | POA: Diagnosis not present

## 2017-02-25 DIAGNOSIS — R296 Repeated falls: Secondary | ICD-10-CM | POA: Diagnosis not present

## 2017-02-25 DIAGNOSIS — I130 Hypertensive heart and chronic kidney disease with heart failure and stage 1 through stage 4 chronic kidney disease, or unspecified chronic kidney disease: Secondary | ICD-10-CM | POA: Diagnosis not present

## 2017-02-25 DIAGNOSIS — S51801D Unspecified open wound of right forearm, subsequent encounter: Secondary | ICD-10-CM | POA: Diagnosis not present

## 2017-02-25 DIAGNOSIS — J449 Chronic obstructive pulmonary disease, unspecified: Secondary | ICD-10-CM | POA: Diagnosis not present

## 2017-02-28 DIAGNOSIS — D509 Iron deficiency anemia, unspecified: Secondary | ICD-10-CM | POA: Diagnosis not present

## 2017-02-28 DIAGNOSIS — S51801D Unspecified open wound of right forearm, subsequent encounter: Secondary | ICD-10-CM | POA: Diagnosis not present

## 2017-02-28 DIAGNOSIS — I509 Heart failure, unspecified: Secondary | ICD-10-CM | POA: Diagnosis not present

## 2017-02-28 DIAGNOSIS — I4891 Unspecified atrial fibrillation: Secondary | ICD-10-CM | POA: Diagnosis not present

## 2017-02-28 DIAGNOSIS — J449 Chronic obstructive pulmonary disease, unspecified: Secondary | ICD-10-CM | POA: Diagnosis not present

## 2017-02-28 DIAGNOSIS — R296 Repeated falls: Secondary | ICD-10-CM | POA: Diagnosis not present

## 2017-02-28 DIAGNOSIS — I130 Hypertensive heart and chronic kidney disease with heart failure and stage 1 through stage 4 chronic kidney disease, or unspecified chronic kidney disease: Secondary | ICD-10-CM | POA: Diagnosis not present

## 2017-02-28 DIAGNOSIS — N183 Chronic kidney disease, stage 3 (moderate): Secondary | ICD-10-CM | POA: Diagnosis not present

## 2017-02-28 DIAGNOSIS — E1122 Type 2 diabetes mellitus with diabetic chronic kidney disease: Secondary | ICD-10-CM | POA: Diagnosis not present

## 2017-03-02 DIAGNOSIS — S51811A Laceration without foreign body of right forearm, initial encounter: Secondary | ICD-10-CM | POA: Diagnosis not present

## 2017-03-02 DIAGNOSIS — S51801A Unspecified open wound of right forearm, initial encounter: Secondary | ICD-10-CM | POA: Diagnosis not present

## 2017-03-04 DIAGNOSIS — I4891 Unspecified atrial fibrillation: Secondary | ICD-10-CM | POA: Diagnosis not present

## 2017-03-04 DIAGNOSIS — E1122 Type 2 diabetes mellitus with diabetic chronic kidney disease: Secondary | ICD-10-CM | POA: Diagnosis not present

## 2017-03-04 DIAGNOSIS — R296 Repeated falls: Secondary | ICD-10-CM | POA: Diagnosis not present

## 2017-03-04 DIAGNOSIS — D509 Iron deficiency anemia, unspecified: Secondary | ICD-10-CM | POA: Diagnosis not present

## 2017-03-04 DIAGNOSIS — I509 Heart failure, unspecified: Secondary | ICD-10-CM | POA: Diagnosis not present

## 2017-03-04 DIAGNOSIS — K922 Gastrointestinal hemorrhage, unspecified: Secondary | ICD-10-CM | POA: Diagnosis not present

## 2017-03-04 DIAGNOSIS — E119 Type 2 diabetes mellitus without complications: Secondary | ICD-10-CM | POA: Diagnosis not present

## 2017-03-04 DIAGNOSIS — I130 Hypertensive heart and chronic kidney disease with heart failure and stage 1 through stage 4 chronic kidney disease, or unspecified chronic kidney disease: Secondary | ICD-10-CM | POA: Diagnosis not present

## 2017-03-04 DIAGNOSIS — E063 Autoimmune thyroiditis: Secondary | ICD-10-CM | POA: Diagnosis not present

## 2017-03-04 DIAGNOSIS — N183 Chronic kidney disease, stage 3 (moderate): Secondary | ICD-10-CM | POA: Diagnosis not present

## 2017-03-04 DIAGNOSIS — Z681 Body mass index (BMI) 19 or less, adult: Secondary | ICD-10-CM | POA: Diagnosis not present

## 2017-03-04 DIAGNOSIS — J449 Chronic obstructive pulmonary disease, unspecified: Secondary | ICD-10-CM | POA: Diagnosis not present

## 2017-03-04 DIAGNOSIS — S51801D Unspecified open wound of right forearm, subsequent encounter: Secondary | ICD-10-CM | POA: Diagnosis not present

## 2017-03-07 DIAGNOSIS — D509 Iron deficiency anemia, unspecified: Secondary | ICD-10-CM | POA: Diagnosis not present

## 2017-03-07 DIAGNOSIS — E1122 Type 2 diabetes mellitus with diabetic chronic kidney disease: Secondary | ICD-10-CM | POA: Diagnosis not present

## 2017-03-07 DIAGNOSIS — N183 Chronic kidney disease, stage 3 (moderate): Secondary | ICD-10-CM | POA: Diagnosis not present

## 2017-03-07 DIAGNOSIS — I4891 Unspecified atrial fibrillation: Secondary | ICD-10-CM | POA: Diagnosis not present

## 2017-03-07 DIAGNOSIS — J449 Chronic obstructive pulmonary disease, unspecified: Secondary | ICD-10-CM | POA: Diagnosis not present

## 2017-03-07 DIAGNOSIS — S51801D Unspecified open wound of right forearm, subsequent encounter: Secondary | ICD-10-CM | POA: Diagnosis not present

## 2017-03-07 DIAGNOSIS — R296 Repeated falls: Secondary | ICD-10-CM | POA: Diagnosis not present

## 2017-03-07 DIAGNOSIS — I130 Hypertensive heart and chronic kidney disease with heart failure and stage 1 through stage 4 chronic kidney disease, or unspecified chronic kidney disease: Secondary | ICD-10-CM | POA: Diagnosis not present

## 2017-03-07 DIAGNOSIS — I509 Heart failure, unspecified: Secondary | ICD-10-CM | POA: Diagnosis not present

## 2017-03-09 DIAGNOSIS — S51811A Laceration without foreign body of right forearm, initial encounter: Secondary | ICD-10-CM | POA: Diagnosis not present

## 2017-03-09 DIAGNOSIS — S40811A Abrasion of right upper arm, initial encounter: Secondary | ICD-10-CM | POA: Diagnosis not present

## 2017-04-01 DIAGNOSIS — I4891 Unspecified atrial fibrillation: Secondary | ICD-10-CM | POA: Diagnosis not present

## 2017-04-01 DIAGNOSIS — E119 Type 2 diabetes mellitus without complications: Secondary | ICD-10-CM | POA: Diagnosis not present

## 2017-04-01 DIAGNOSIS — K922 Gastrointestinal hemorrhage, unspecified: Secondary | ICD-10-CM | POA: Diagnosis not present

## 2017-04-01 DIAGNOSIS — N390 Urinary tract infection, site not specified: Secondary | ICD-10-CM | POA: Diagnosis not present

## 2017-04-01 DIAGNOSIS — M109 Gout, unspecified: Secondary | ICD-10-CM | POA: Diagnosis not present

## 2017-04-01 DIAGNOSIS — N183 Chronic kidney disease, stage 3 (moderate): Secondary | ICD-10-CM | POA: Diagnosis not present

## 2017-04-01 DIAGNOSIS — E063 Autoimmune thyroiditis: Secondary | ICD-10-CM | POA: Diagnosis not present

## 2017-04-01 DIAGNOSIS — I509 Heart failure, unspecified: Secondary | ICD-10-CM | POA: Diagnosis not present

## 2017-04-01 DIAGNOSIS — R300 Dysuria: Secondary | ICD-10-CM | POA: Diagnosis not present

## 2017-05-03 DIAGNOSIS — E559 Vitamin D deficiency, unspecified: Secondary | ICD-10-CM | POA: Diagnosis not present

## 2017-05-03 DIAGNOSIS — G4700 Insomnia, unspecified: Secondary | ICD-10-CM | POA: Diagnosis not present

## 2017-05-03 DIAGNOSIS — K922 Gastrointestinal hemorrhage, unspecified: Secondary | ICD-10-CM | POA: Diagnosis not present

## 2017-05-03 DIAGNOSIS — M109 Gout, unspecified: Secondary | ICD-10-CM | POA: Diagnosis not present

## 2017-05-03 DIAGNOSIS — E063 Autoimmune thyroiditis: Secondary | ICD-10-CM | POA: Diagnosis not present

## 2017-05-03 DIAGNOSIS — Z79899 Other long term (current) drug therapy: Secondary | ICD-10-CM | POA: Diagnosis not present

## 2017-05-03 DIAGNOSIS — E119 Type 2 diabetes mellitus without complications: Secondary | ICD-10-CM | POA: Diagnosis not present

## 2017-05-03 DIAGNOSIS — I4891 Unspecified atrial fibrillation: Secondary | ICD-10-CM | POA: Diagnosis not present

## 2017-05-03 DIAGNOSIS — N183 Chronic kidney disease, stage 3 (moderate): Secondary | ICD-10-CM | POA: Diagnosis not present

## 2017-05-03 DIAGNOSIS — I509 Heart failure, unspecified: Secondary | ICD-10-CM | POA: Diagnosis not present

## 2017-06-17 DIAGNOSIS — G4700 Insomnia, unspecified: Secondary | ICD-10-CM | POA: Diagnosis not present

## 2017-06-17 DIAGNOSIS — E119 Type 2 diabetes mellitus without complications: Secondary | ICD-10-CM | POA: Diagnosis not present

## 2017-06-17 DIAGNOSIS — I4891 Unspecified atrial fibrillation: Secondary | ICD-10-CM | POA: Diagnosis not present

## 2017-06-17 DIAGNOSIS — K922 Gastrointestinal hemorrhage, unspecified: Secondary | ICD-10-CM | POA: Diagnosis not present

## 2017-06-17 DIAGNOSIS — E559 Vitamin D deficiency, unspecified: Secondary | ICD-10-CM | POA: Diagnosis not present

## 2017-06-17 DIAGNOSIS — M109 Gout, unspecified: Secondary | ICD-10-CM | POA: Diagnosis not present

## 2017-06-17 DIAGNOSIS — N183 Chronic kidney disease, stage 3 (moderate): Secondary | ICD-10-CM | POA: Diagnosis not present

## 2017-06-17 DIAGNOSIS — E063 Autoimmune thyroiditis: Secondary | ICD-10-CM | POA: Diagnosis not present

## 2017-06-17 DIAGNOSIS — D649 Anemia, unspecified: Secondary | ICD-10-CM | POA: Diagnosis not present

## 2017-06-22 DIAGNOSIS — N183 Chronic kidney disease, stage 3 (moderate): Secondary | ICD-10-CM | POA: Diagnosis not present

## 2017-06-22 DIAGNOSIS — E559 Vitamin D deficiency, unspecified: Secondary | ICD-10-CM | POA: Diagnosis not present

## 2017-06-22 DIAGNOSIS — E063 Autoimmune thyroiditis: Secondary | ICD-10-CM | POA: Diagnosis not present

## 2017-06-22 DIAGNOSIS — I4891 Unspecified atrial fibrillation: Secondary | ICD-10-CM | POA: Diagnosis not present

## 2017-06-22 DIAGNOSIS — M109 Gout, unspecified: Secondary | ICD-10-CM | POA: Diagnosis not present

## 2017-06-22 DIAGNOSIS — I509 Heart failure, unspecified: Secondary | ICD-10-CM | POA: Diagnosis not present

## 2017-06-22 DIAGNOSIS — E119 Type 2 diabetes mellitus without complications: Secondary | ICD-10-CM | POA: Diagnosis not present

## 2017-06-22 DIAGNOSIS — N390 Urinary tract infection, site not specified: Secondary | ICD-10-CM | POA: Diagnosis not present

## 2017-06-22 DIAGNOSIS — D649 Anemia, unspecified: Secondary | ICD-10-CM | POA: Diagnosis not present

## 2017-06-22 DIAGNOSIS — R300 Dysuria: Secondary | ICD-10-CM | POA: Diagnosis not present

## 2017-08-16 DIAGNOSIS — Z79899 Other long term (current) drug therapy: Secondary | ICD-10-CM | POA: Diagnosis not present

## 2017-08-16 DIAGNOSIS — E063 Autoimmune thyroiditis: Secondary | ICD-10-CM | POA: Diagnosis not present

## 2017-08-16 DIAGNOSIS — K922 Gastrointestinal hemorrhage, unspecified: Secondary | ICD-10-CM | POA: Diagnosis not present

## 2017-08-16 DIAGNOSIS — I4891 Unspecified atrial fibrillation: Secondary | ICD-10-CM | POA: Diagnosis not present

## 2017-08-16 DIAGNOSIS — E538 Deficiency of other specified B group vitamins: Secondary | ICD-10-CM | POA: Diagnosis not present

## 2017-08-16 DIAGNOSIS — E119 Type 2 diabetes mellitus without complications: Secondary | ICD-10-CM | POA: Diagnosis not present

## 2017-08-16 DIAGNOSIS — N183 Chronic kidney disease, stage 3 (moderate): Secondary | ICD-10-CM | POA: Diagnosis not present

## 2017-08-16 DIAGNOSIS — M109 Gout, unspecified: Secondary | ICD-10-CM | POA: Diagnosis not present

## 2017-08-16 DIAGNOSIS — I509 Heart failure, unspecified: Secondary | ICD-10-CM | POA: Diagnosis not present

## 2017-08-16 DIAGNOSIS — E559 Vitamin D deficiency, unspecified: Secondary | ICD-10-CM | POA: Diagnosis not present

## 2017-09-27 DIAGNOSIS — I509 Heart failure, unspecified: Secondary | ICD-10-CM | POA: Diagnosis not present

## 2017-09-27 DIAGNOSIS — N183 Chronic kidney disease, stage 3 (moderate): Secondary | ICD-10-CM | POA: Diagnosis not present

## 2017-09-27 DIAGNOSIS — Z1339 Encounter for screening examination for other mental health and behavioral disorders: Secondary | ICD-10-CM | POA: Diagnosis not present

## 2017-09-27 DIAGNOSIS — E559 Vitamin D deficiency, unspecified: Secondary | ICD-10-CM | POA: Diagnosis not present

## 2017-09-27 DIAGNOSIS — K922 Gastrointestinal hemorrhage, unspecified: Secondary | ICD-10-CM | POA: Diagnosis not present

## 2017-09-27 DIAGNOSIS — G4700 Insomnia, unspecified: Secondary | ICD-10-CM | POA: Diagnosis not present

## 2017-09-27 DIAGNOSIS — Z6820 Body mass index (BMI) 20.0-20.9, adult: Secondary | ICD-10-CM | POA: Diagnosis not present

## 2017-09-27 DIAGNOSIS — I4891 Unspecified atrial fibrillation: Secondary | ICD-10-CM | POA: Diagnosis not present

## 2017-09-27 DIAGNOSIS — M109 Gout, unspecified: Secondary | ICD-10-CM | POA: Diagnosis not present

## 2017-11-15 DIAGNOSIS — E063 Autoimmune thyroiditis: Secondary | ICD-10-CM | POA: Diagnosis not present

## 2017-11-15 DIAGNOSIS — I509 Heart failure, unspecified: Secondary | ICD-10-CM | POA: Diagnosis not present

## 2017-11-15 DIAGNOSIS — E538 Deficiency of other specified B group vitamins: Secondary | ICD-10-CM | POA: Diagnosis not present

## 2017-11-15 DIAGNOSIS — E559 Vitamin D deficiency, unspecified: Secondary | ICD-10-CM | POA: Diagnosis not present

## 2017-11-15 DIAGNOSIS — E118 Type 2 diabetes mellitus with unspecified complications: Secondary | ICD-10-CM | POA: Diagnosis not present

## 2017-11-15 DIAGNOSIS — Z79899 Other long term (current) drug therapy: Secondary | ICD-10-CM | POA: Diagnosis not present

## 2017-11-15 DIAGNOSIS — N390 Urinary tract infection, site not specified: Secondary | ICD-10-CM | POA: Diagnosis not present

## 2017-11-15 DIAGNOSIS — K922 Gastrointestinal hemorrhage, unspecified: Secondary | ICD-10-CM | POA: Diagnosis not present

## 2017-11-15 DIAGNOSIS — I4891 Unspecified atrial fibrillation: Secondary | ICD-10-CM | POA: Diagnosis not present

## 2017-11-15 DIAGNOSIS — N183 Chronic kidney disease, stage 3 (moderate): Secondary | ICD-10-CM | POA: Diagnosis not present

## 2017-11-21 DIAGNOSIS — S12191A Other nondisplaced fracture of second cervical vertebra, initial encounter for closed fracture: Secondary | ICD-10-CM | POA: Diagnosis not present

## 2017-11-21 DIAGNOSIS — R748 Abnormal levels of other serum enzymes: Secondary | ICD-10-CM

## 2017-11-21 DIAGNOSIS — S199XXA Unspecified injury of neck, initial encounter: Secondary | ICD-10-CM | POA: Diagnosis not present

## 2017-11-21 DIAGNOSIS — N179 Acute kidney failure, unspecified: Secondary | ICD-10-CM

## 2017-11-21 DIAGNOSIS — R001 Bradycardia, unspecified: Secondary | ICD-10-CM

## 2017-11-21 DIAGNOSIS — S0990XA Unspecified injury of head, initial encounter: Secondary | ICD-10-CM | POA: Diagnosis not present

## 2017-11-21 DIAGNOSIS — R079 Chest pain, unspecified: Secondary | ICD-10-CM | POA: Diagnosis not present

## 2017-11-21 DIAGNOSIS — I10 Essential (primary) hypertension: Secondary | ICD-10-CM | POA: Diagnosis not present

## 2017-11-21 DIAGNOSIS — E039 Hypothyroidism, unspecified: Secondary | ICD-10-CM

## 2017-11-21 DIAGNOSIS — N390 Urinary tract infection, site not specified: Secondary | ICD-10-CM

## 2017-11-22 DIAGNOSIS — I509 Heart failure, unspecified: Secondary | ICD-10-CM | POA: Diagnosis not present

## 2017-11-22 DIAGNOSIS — Z4682 Encounter for fitting and adjustment of non-vascular catheter: Secondary | ICD-10-CM | POA: Diagnosis not present

## 2017-11-22 DIAGNOSIS — S12191A Other nondisplaced fracture of second cervical vertebra, initial encounter for closed fracture: Secondary | ICD-10-CM | POA: Diagnosis not present

## 2017-11-22 DIAGNOSIS — S22029A Unspecified fracture of second thoracic vertebra, initial encounter for closed fracture: Secondary | ICD-10-CM | POA: Diagnosis not present

## 2017-11-22 DIAGNOSIS — N390 Urinary tract infection, site not specified: Secondary | ICD-10-CM | POA: Diagnosis not present

## 2017-11-22 DIAGNOSIS — N183 Chronic kidney disease, stage 3 (moderate): Secondary | ICD-10-CM

## 2017-11-22 DIAGNOSIS — I4891 Unspecified atrial fibrillation: Secondary | ICD-10-CM | POA: Diagnosis not present

## 2017-11-22 DIAGNOSIS — E039 Hypothyroidism, unspecified: Secondary | ICD-10-CM | POA: Diagnosis not present

## 2017-11-22 DIAGNOSIS — G9341 Metabolic encephalopathy: Secondary | ICD-10-CM | POA: Diagnosis not present

## 2017-11-22 DIAGNOSIS — N179 Acute kidney failure, unspecified: Secondary | ICD-10-CM | POA: Diagnosis not present

## 2017-11-22 DIAGNOSIS — R4689 Other symptoms and signs involving appearance and behavior: Secondary | ICD-10-CM

## 2017-11-22 DIAGNOSIS — S0990XA Unspecified injury of head, initial encounter: Secondary | ICD-10-CM | POA: Diagnosis not present

## 2017-11-22 DIAGNOSIS — S199XXA Unspecified injury of neck, initial encounter: Secondary | ICD-10-CM | POA: Diagnosis not present

## 2017-11-22 DIAGNOSIS — R748 Abnormal levels of other serum enzymes: Secondary | ICD-10-CM | POA: Diagnosis not present

## 2017-11-22 DIAGNOSIS — I130 Hypertensive heart and chronic kidney disease with heart failure and stage 1 through stage 4 chronic kidney disease, or unspecified chronic kidney disease: Secondary | ICD-10-CM | POA: Diagnosis not present

## 2017-11-22 DIAGNOSIS — R29818 Other symptoms and signs involving the nervous system: Secondary | ICD-10-CM | POA: Diagnosis not present

## 2017-11-22 DIAGNOSIS — R001 Bradycardia, unspecified: Secondary | ICD-10-CM | POA: Diagnosis not present

## 2017-11-22 DIAGNOSIS — R410 Disorientation, unspecified: Secondary | ICD-10-CM | POA: Diagnosis not present

## 2017-11-22 DIAGNOSIS — R079 Chest pain, unspecified: Secondary | ICD-10-CM | POA: Diagnosis not present

## 2017-11-23 ENCOUNTER — Inpatient Hospital Stay (HOSPITAL_COMMUNITY): Payer: Medicare HMO

## 2017-11-23 ENCOUNTER — Inpatient Hospital Stay (HOSPITAL_COMMUNITY)
Admission: AD | Admit: 2017-11-23 | Discharge: 2017-12-18 | DRG: 308 | Disposition: E | Payer: Medicare HMO | Source: Other Acute Inpatient Hospital | Attending: Internal Medicine | Admitting: Internal Medicine

## 2017-11-23 ENCOUNTER — Encounter (HOSPITAL_COMMUNITY): Payer: Self-pay | Admitting: Pulmonary Disease

## 2017-11-23 DIAGNOSIS — N179 Acute kidney failure, unspecified: Secondary | ICD-10-CM | POA: Diagnosis not present

## 2017-11-23 DIAGNOSIS — I482 Chronic atrial fibrillation, unspecified: Secondary | ICD-10-CM

## 2017-11-23 DIAGNOSIS — E872 Acidosis: Secondary | ICD-10-CM | POA: Diagnosis not present

## 2017-11-23 DIAGNOSIS — I130 Hypertensive heart and chronic kidney disease with heart failure and stage 1 through stage 4 chronic kidney disease, or unspecified chronic kidney disease: Secondary | ICD-10-CM | POA: Diagnosis not present

## 2017-11-23 DIAGNOSIS — Z7189 Other specified counseling: Secondary | ICD-10-CM

## 2017-11-23 DIAGNOSIS — N189 Chronic kidney disease, unspecified: Secondary | ICD-10-CM

## 2017-11-23 DIAGNOSIS — I5032 Chronic diastolic (congestive) heart failure: Secondary | ICD-10-CM | POA: Diagnosis not present

## 2017-11-23 DIAGNOSIS — I472 Ventricular tachycardia: Secondary | ICD-10-CM | POA: Diagnosis not present

## 2017-11-23 DIAGNOSIS — I272 Pulmonary hypertension, unspecified: Secondary | ICD-10-CM | POA: Diagnosis present

## 2017-11-23 DIAGNOSIS — G9341 Metabolic encephalopathy: Secondary | ICD-10-CM | POA: Diagnosis not present

## 2017-11-23 DIAGNOSIS — R001 Bradycardia, unspecified: Secondary | ICD-10-CM

## 2017-11-23 DIAGNOSIS — Z90710 Acquired absence of both cervix and uterus: Secondary | ICD-10-CM

## 2017-11-23 DIAGNOSIS — D696 Thrombocytopenia, unspecified: Secondary | ICD-10-CM | POA: Diagnosis present

## 2017-11-23 DIAGNOSIS — N184 Chronic kidney disease, stage 4 (severe): Secondary | ICD-10-CM | POA: Diagnosis present

## 2017-11-23 DIAGNOSIS — I4581 Long QT syndrome: Secondary | ICD-10-CM | POA: Diagnosis present

## 2017-11-23 DIAGNOSIS — Z781 Physical restraint status: Secondary | ICD-10-CM | POA: Diagnosis not present

## 2017-11-23 DIAGNOSIS — N390 Urinary tract infection, site not specified: Secondary | ICD-10-CM | POA: Diagnosis not present

## 2017-11-23 DIAGNOSIS — Z515 Encounter for palliative care: Secondary | ICD-10-CM | POA: Diagnosis not present

## 2017-11-23 DIAGNOSIS — R5381 Other malaise: Secondary | ICD-10-CM | POA: Diagnosis not present

## 2017-11-23 DIAGNOSIS — I4901 Ventricular fibrillation: Secondary | ICD-10-CM | POA: Diagnosis not present

## 2017-11-23 DIAGNOSIS — E039 Hypothyroidism, unspecified: Secondary | ICD-10-CM | POA: Diagnosis present

## 2017-11-23 DIAGNOSIS — I495 Sick sinus syndrome: Principal | ICD-10-CM | POA: Diagnosis present

## 2017-11-23 DIAGNOSIS — Z66 Do not resuscitate: Secondary | ICD-10-CM | POA: Diagnosis not present

## 2017-11-23 DIAGNOSIS — N183 Chronic kidney disease, stage 3 (moderate): Secondary | ICD-10-CM | POA: Diagnosis not present

## 2017-11-23 DIAGNOSIS — R55 Syncope and collapse: Secondary | ICD-10-CM | POA: Diagnosis not present

## 2017-11-23 DIAGNOSIS — Z8673 Personal history of transient ischemic attack (TIA), and cerebral infarction without residual deficits: Secondary | ICD-10-CM

## 2017-11-23 DIAGNOSIS — R1310 Dysphagia, unspecified: Secondary | ICD-10-CM | POA: Diagnosis not present

## 2017-11-23 DIAGNOSIS — F17220 Nicotine dependence, chewing tobacco, uncomplicated: Secondary | ICD-10-CM | POA: Diagnosis not present

## 2017-11-23 DIAGNOSIS — I083 Combined rheumatic disorders of mitral, aortic and tricuspid valves: Secondary | ICD-10-CM | POA: Diagnosis present

## 2017-11-23 DIAGNOSIS — K5900 Constipation, unspecified: Secondary | ICD-10-CM | POA: Diagnosis not present

## 2017-11-23 DIAGNOSIS — W1830XA Fall on same level, unspecified, initial encounter: Secondary | ICD-10-CM | POA: Diagnosis not present

## 2017-11-23 DIAGNOSIS — Y92009 Unspecified place in unspecified non-institutional (private) residence as the place of occurrence of the external cause: Secondary | ICD-10-CM | POA: Diagnosis not present

## 2017-11-23 DIAGNOSIS — D649 Anemia, unspecified: Secondary | ICD-10-CM | POA: Diagnosis not present

## 2017-11-23 DIAGNOSIS — I4891 Unspecified atrial fibrillation: Secondary | ICD-10-CM | POA: Diagnosis not present

## 2017-11-23 DIAGNOSIS — R4689 Other symptoms and signs involving appearance and behavior: Secondary | ICD-10-CM | POA: Diagnosis not present

## 2017-11-23 HISTORY — DX: Unspecified osteoarthritis, unspecified site: M19.90

## 2017-11-23 HISTORY — DX: Chronic kidney disease, stage 4 (severe): N18.4

## 2017-11-23 HISTORY — DX: Chronic diastolic (congestive) heart failure: I50.32

## 2017-11-23 HISTORY — DX: Pulmonary hypertension, unspecified: I27.20

## 2017-11-23 HISTORY — DX: Gout due to renal impairment, left ankle and foot: M10.372

## 2017-11-23 HISTORY — DX: Hypothyroidism, unspecified: E03.9

## 2017-11-23 HISTORY — DX: Peptic ulcer, site unspecified, unspecified as acute or chronic, without hemorrhage or perforation: K27.9

## 2017-11-23 HISTORY — DX: Chronic atrial fibrillation, unspecified: I48.20

## 2017-11-23 HISTORY — DX: Repeated falls: R29.6

## 2017-11-23 HISTORY — DX: Abnormal electrocardiogram (ECG) (EKG): R94.31

## 2017-11-23 HISTORY — DX: Depression, unspecified: F32.A

## 2017-11-23 HISTORY — DX: Traumatic subdural hemorrhage with loss of consciousness of unspecified duration, initial encounter: S06.5X9A

## 2017-11-23 HISTORY — DX: Major depressive disorder, single episode, unspecified: F32.9

## 2017-11-23 HISTORY — DX: Traumatic subdural hemorrhage with loss of consciousness status unknown, initial encounter: S06.5XAA

## 2017-11-23 HISTORY — DX: Anxiety disorder, unspecified: F41.9

## 2017-11-23 HISTORY — DX: Anemia, unspecified: D64.9

## 2017-11-23 HISTORY — DX: Unspecified fall, initial encounter: W19.XXXA

## 2017-11-23 LAB — COMPREHENSIVE METABOLIC PANEL
ALK PHOS: 59 U/L (ref 38–126)
ALT: 29 U/L (ref 0–44)
AST: 84 U/L — AB (ref 15–41)
Albumin: 3.6 g/dL (ref 3.5–5.0)
Anion gap: 15 (ref 5–15)
BUN: 46 mg/dL — ABNORMAL HIGH (ref 8–23)
CALCIUM: 9.8 mg/dL (ref 8.9–10.3)
CHLORIDE: 104 mmol/L (ref 98–111)
CO2: 22 mmol/L (ref 22–32)
CREATININE: 2.44 mg/dL — AB (ref 0.44–1.00)
GFR calc Af Amer: 20 mL/min — ABNORMAL LOW (ref >60)
GFR, EST NON AFRICAN AMERICAN: 17 mL/min — AB (ref >60)
Glucose, Bld: 153 mg/dL — ABNORMAL HIGH (ref 70–99)
Potassium: 4.8 mmol/L (ref 3.5–5.1)
Sodium: 141 mmol/L (ref 135–145)
Total Bilirubin: 1.9 mg/dL — ABNORMAL HIGH (ref 0.3–1.2)
Total Protein: 6.6 g/dL (ref 6.5–8.1)

## 2017-11-23 LAB — CBC WITH DIFFERENTIAL/PLATELET
Abs Immature Granulocytes: 0 10*3/uL (ref 0.0–0.1)
Basophils Absolute: 0 10*3/uL (ref 0.0–0.1)
Basophils Relative: 0 %
EOS PCT: 0 %
Eosinophils Absolute: 0 10*3/uL (ref 0.0–0.7)
HEMATOCRIT: 32.9 % — AB (ref 36.0–46.0)
HEMOGLOBIN: 10.5 g/dL — AB (ref 12.0–15.0)
IMMATURE GRANULOCYTES: 0 %
LYMPHS ABS: 0.6 10*3/uL — AB (ref 0.7–4.0)
LYMPHS PCT: 5 %
MCH: 31.2 pg (ref 26.0–34.0)
MCHC: 31.9 g/dL (ref 30.0–36.0)
MCV: 97.6 fL (ref 78.0–100.0)
MONOS PCT: 7 %
Monocytes Absolute: 0.8 10*3/uL (ref 0.1–1.0)
Neutro Abs: 10.1 10*3/uL — ABNORMAL HIGH (ref 1.7–7.7)
Neutrophils Relative %: 88 %
Platelets: 70 10*3/uL — ABNORMAL LOW (ref 150–400)
RBC: 3.37 MIL/uL — AB (ref 3.87–5.11)
RDW: 14.1 % (ref 11.5–15.5)
WBC: 11.6 10*3/uL — AB (ref 4.0–10.5)

## 2017-11-23 LAB — PROTIME-INR
INR: 1.48
Prothrombin Time: 17.8 seconds — ABNORMAL HIGH (ref 11.4–15.2)

## 2017-11-23 LAB — TSH: TSH: 3.011 u[IU]/mL (ref 0.350–4.500)

## 2017-11-23 LAB — MAGNESIUM: MAGNESIUM: 1.8 mg/dL (ref 1.7–2.4)

## 2017-11-23 LAB — BRAIN NATRIURETIC PEPTIDE: B Natriuretic Peptide: 2272.4 pg/mL — ABNORMAL HIGH (ref 0.0–100.0)

## 2017-11-23 LAB — APTT: APTT: 36 s (ref 24–36)

## 2017-11-23 LAB — MRSA PCR SCREENING: MRSA BY PCR: NEGATIVE

## 2017-11-23 LAB — T4, FREE: Free T4: 1.32 ng/dL (ref 0.82–1.77)

## 2017-11-23 MED ORDER — ENOXAPARIN SODIUM 40 MG/0.4ML ~~LOC~~ SOLN: 40.0000 mg | Freq: Two times a day (BID) | SUBCUTANEOUS | Status: DC

## 2017-11-23 MED ORDER — MELATONIN 3 MG PO TABS
3.0000 mg | ORAL_TABLET | Freq: Every evening | ORAL | Status: DC | PRN
  Filled 2017-11-23 (×2): qty 1

## 2017-11-23 MED ORDER — ACETAMINOPHEN 325 MG PO TABS
650.0000 mg | ORAL_TABLET | ORAL | Status: DC | PRN
  Administered 2017-11-26 – 2017-11-29 (×3): 650 mg via ORAL
  Filled 2017-11-23 (×3): qty 2

## 2017-11-23 MED ORDER — ISOPROTERENOL HCL 0.2 MG/ML IJ SOLN
2.0000 ug/min | INTRAVENOUS | Status: DC
  Administered 2017-11-23: 2 ug/min via INTRAVENOUS
  Administered 2017-11-24: 3 ug/min via INTRAVENOUS
  Administered 2017-11-24 – 2017-11-25 (×2): 1 ug/min via INTRAVENOUS
  Filled 2017-11-23 (×4): qty 5

## 2017-11-23 MED ORDER — MAGNESIUM SULFATE 2 GM/50ML IV SOLN
2.0000 g | Freq: Once | INTRAVENOUS | Status: AC
  Administered 2017-11-23: 2 g via INTRAVENOUS
  Filled 2017-11-23: qty 50

## 2017-11-23 MED ORDER — SODIUM CHLORIDE 0.9 % IV SOLN
INTRAVENOUS | Status: DC
  Administered 2017-11-23 – 2017-11-27 (×5): via INTRAVENOUS

## 2017-11-23 MED ORDER — SODIUM CHLORIDE 0.9 % IV SOLN: INTRAVENOUS | Status: DC

## 2017-11-23 MED ORDER — DOPAMINE-DEXTROSE 3.2-5 MG/ML-% IV SOLN: 0.0000 ug/kg/min | INTRAVENOUS | Status: DC

## 2017-11-23 MED ORDER — SODIUM CHLORIDE 0.9% FLUSH
10.0000 mL | Freq: Two times a day (BID) | INTRAVENOUS | Status: DC
  Administered 2017-11-23 – 2017-11-24 (×2): 10 mL
  Administered 2017-11-24: 20 mL
  Administered 2017-11-25 (×2): 10 mL
  Administered 2017-11-26: 30 mL
  Administered 2017-11-27 – 2017-11-29 (×5): 10 mL

## 2017-11-23 MED ORDER — DOPAMINE-DEXTROSE 3.2-5 MG/ML-% IV SOLN
0.0000 ug/kg/min | INTRAVENOUS | Status: DC
  Administered 2017-11-23: 5 ug/kg/min via INTRAVENOUS
  Filled 2017-11-23: qty 250

## 2017-11-23 MED ORDER — SODIUM CHLORIDE 0.9% FLUSH: 10.0000 mL | INTRAVENOUS | Status: DC | PRN

## 2017-11-23 MED ORDER — NICOTINE 7 MG/24HR TD PT24
7.0000 mg | MEDICATED_PATCH | Freq: Every day | TRANSDERMAL | Status: DC
  Administered 2017-11-23 – 2017-11-29 (×7): 7 mg via TRANSDERMAL
  Filled 2017-11-23 (×8): qty 1

## 2017-11-23 MED ORDER — ORAL CARE MOUTH RINSE
15.0000 mL | Freq: Two times a day (BID) | OROMUCOSAL | Status: DC
  Administered 2017-11-23 – 2017-11-29 (×11): 15 mL via OROMUCOSAL

## 2017-11-23 MED ORDER — CHLORHEXIDINE GLUCONATE CLOTH 2 % EX PADS
6.0000 | MEDICATED_PAD | Freq: Every day | CUTANEOUS | Status: DC
  Administered 2017-11-23 – 2017-11-29 (×7): 6 via TOPICAL

## 2017-11-23 NOTE — Code Documentation (Signed)
  Patient Name: Taylor Coleman   MRN: [id number]   Date of Birth/ Sex: 1928/09/03 , female      Admission Date: 11/17/2017  Attending Provider: Lynnell CatalanAgarwala, Ravi, MD  Primary Diagnosis: <principal problem not specified>   Indication: Pt was in her usual state of health until this PM, when she was noted to be unreponsive and went into ventricular tachycardia. Code blue was subsequently called. At the time of arrival on scene, ACLS protocol was underway.   Technical Description:  - CPR performance duration:  0   - Was defibrillation or cardioversion used? Yes   - Was external pacer placed? Yes  - Was patient intubated pre/post CPR? No   Medications Administered: Y = Yes; Blank = No Amiodarone    Atropine    Calcium    Epinephrine    Lidocaine    Magnesium    Norepinephrine    Phenylephrine    Sodium bicarbonate    Vasopressin    Had been on epinephrine drip  Post CPR evaluation:  - Final Status - Was patient successfully resuscitated ? Yes - What is current rhythm? Atrial fibrillation - What is current hemodynamic status? stable  Miscellaneous Information:  - Labs sent, including: None  - Primary team notified?  Yes  - Family Notified? No  - Additional notes/ transfer status: Remained in room     Claudean SeveranceKrienke, Arrow Emmerich M, MD  11/22/2017, 7:29 PM

## 2017-11-23 NOTE — H&P (Signed)
Pulmonary and Critical Care  Taylor Coleman is an 84 y.o. female.   Chief Complaint:  Fall at home Reason for Transfer: persistent bradycardia. HPI:  Limited history from patient due to mild confusion which has started since admission [facility]. History obtained from [facility] documentation and patient's family. 84 year old woman who lives at home alone.   Sustained a first fall from standing after getting up from sitting on her porch. Patient denied loss of consciousness or other associated premonitory or autonomic symptoms.    In the ED she was found to bradycardic into the 30's.  Heart rate improved with initiation of dopamine infusion. Patient was on metoprolol at home but little response to glucagon.   Work up for possible injuries from her fall were negative - CT showed extensive old microvascular strokes. Chronic T2 compression fracture.  At present the patient has no voiced complaints and is requesting to go home.  Past Medical History:  Diagnosis Date  . Anxiety   . Atrial fibrillation (HCC)    Not anticoagulated due to prior GI bleed.  . CHF (congestive heart failure) (HCC)   . Depression   . Gout due to renal impairment involving toe of left foot   . Hypertension   . Osteoarthritis   . Peptic ulcer disease     Past Surgical History:  Procedure Laterality Date  . ABDOMINAL HYSTERECTOMY    . TUBAL LIGATION      Family History: CAD and cancer. Social History:  reports that she has quit smoking. Her smokeless tobacco use includes chew. She reports that she does not drink alcohol or use drugs. She lives alone and uses a cane. Receives help from family for ADL's.  Allergies:  Allergies  Allergen Reactions  . Penicillins Rash    Medications Prior to Admission  Medication Sig Dispense Refill  . enalapril (VASOTEC) 20 MG tablet Take 20 mg by mouth daily.      . furosemide (LASIX) 40 MG tablet Take 40 mg by mouth 2 (two) times daily.      . metoprolol (LOPRESSOR) 50 MG  tablet Take 50 mg by mouth 2 (two) times daily.      . nitroGLYCERIN (NITROSTAT) 0.4 MG SL tablet Place 0.4 mg under the tongue every 5 (five) minutes as needed. For chest pains     . ranitidine (ZANTAC) 150 MG tablet Take 150 mg by mouth 2 (two) times daily.        No results found for this or any previous visit (from the past 48 hour(s)). No results found.  Review of Systems  Constitutional: Negative.   HENT: Negative.   Eyes:       Wears glasses.  Respiratory: Negative.   Cardiovascular: Negative.   Gastrointestinal: Negative.   Genitourinary: Positive for dysuria and urgency.  Musculoskeletal: Positive for falls and joint pain (predominantly in knees which she feels sometimes give out.).  Skin: Negative.   Neurological: Negative for dizziness, focal weakness, seizures, loss of consciousness and headaches.  Endo/Heme/Allergies: Negative.  Environmental allergies:    Psychiatric/Behavioral: Positive for depression. The patient is nervous/anxious.     Blood pressure 106/65, pulse (!) 50, temperature 98.6 F (37 C), temperature source Oral, resp. rate 17, weight 110 lb 0.2 oz (49.9 kg), SpO2 95 %. Physical Exam  Constitutional: No distress.  Frail, small stature  HENT:  Head: Normocephalic and atraumatic.  Mouth/Throat: Oropharynx is clear and moist.  Eyes: Pupils are equal, round, and reactive to light. Conjunctivae are  normal.  Neck: Normal range of motion. No JVD present.  Cardiovascular: Normal rate, S1 normal, S2 normal and normal pulses. An irregularly irregular rhythm present. PMI is not displaced. Exam reveals no S3, no S4 and no friction rub.  Murmur heard.  Systolic (midsystolic) murmur is present with a grade of 3/6. Respiratory: Breath sounds normal. She is in respiratory distress.  GI: Soft. Bowel sounds are normal.  Musculoskeletal: Normal range of motion.       Right shoulder: She exhibits normal range of motion and no tenderness.       Left shoulder: She  exhibits normal range of motion and no tenderness.  Neurological: She is alert. She has normal strength. No cranial nerve deficit or sensory deficit. GCS eye subscore is 4. GCS verbal subscore is 5. GCS motor subscore is 6.  Skin: She is not diaphoretic.    EKG: telemetry tracing shows AF with controlled HR at 78 on 307mcg/kg/min of dopamine.  Notable Labs from [facility]: creatinine 2.5, normal electrolytes. Thrombocytopenia at 68.   Echocardiogram: LVEF 70%. Pseudonormal LV filling. +++ MR/TR  Repeat investigations here are pending.  Assessment/Plan   Syncopal episode associated with sick sinus syndrome/tachy-brady syndrome  Presently hemodynamically stable with acceptable HR on dopamine.  Bradycardia may have been triggered by recent increase in metoprolol.  However patient continues to have HR drops to the 50's despite being off metoprolol for >48h and dopamine infusion.    There is probably sufficient circumstantial evidence for symptomatic bradycardia to warrant PPM especially given the patient's relatively good functional status.    History of mechanical falls  Cardiac syncope notwithstanding, the patient has complained for difficulty walking and warrants PT evaluation.   Chronic atrial fibrillation  Presently adequate rate control.  At high risk for bleeding complications due to falls and prior GI bleed, but already shows evidence of cerebral infarction.   Warrants at least ASA.   CKD IV   Creatinine reportedly higher than baseline.  Given clinically euvolemic, will hold home diuretics.  CRITICAL CARE Performed by: Lynnell Catalanavi Raelynn Corron  Total critical care time: 35 minutes  Critical care time was exclusive of separately billable procedures and treating other patients.  Critical care was necessary to treat or prevent imminent or life-threatening deterioration to risk of symptomatic bradycardia.  Critical care was time spent personally by me on the following activities:  development of treatment plan with patient and/or surrogate as well as nursing, discussions with consultants, evaluation of patient's response to treatment, examination of patient, obtaining history from patient or surrogate, ordering and performing treatments and interventions, ordering and review of laboratory studies, ordering and review of radiographic studies, pulse oximetry and re-evaluation of patient's condition.  Lynnell Catalanavi Isella Slatten, MD [facility] ICU Physician [facility]  Pager: [phone] Mobile: [phone] After hours: [phone].   11/29/2017, 4:41 PM

## 2017-11-23 NOTE — Evaluation (Signed)
Clinical/Bedside Swallow Evaluation Patient Details  Name: Taylor FlemingSara L Winterrowd MRN: [id number] Date of Birth: 06/15/1928  Today's Date: 11/20/2017 Time: SLP Start Time (ACUTE ONLY): 1605 SLP Stop Time (ACUTE ONLY): 1622 SLP Time Calculation (min) (ACUTE ONLY): 17 min  Past Medical History:  Past Medical History:  Diagnosis Date  . Anxiety   . Atrial fibrillation (HCC)    Not anticoagulated due to prior GI bleed.  . CHF (congestive heart failure) (HCC)   . Depression   . Gout due to renal impairment involving toe of left foot   . Hypertension   . Osteoarthritis   . Peptic ulcer disease    Past Surgical History:  Past Surgical History:  Procedure Laterality Date  . ABDOMINAL HYSTERECTOMY    . TUBAL LIGATION     HPI:  Pt is an 84 yo female [facility] and [facility] brady[facility]. CT Head negative. Found to have UTI. At [facility] she was noted to be coughing with thin liquids and pocketing food, therefore swallow evaluation was ordered. PMH: anxiety, afib, CHF, depression, gout, HTN, arthritis, PUD   Assessment / Plan / Recommendation Clinical Impression  Pt is cognitively not ready for a PO diet. She is disoriented, intermittently following commands despite Max cues, and very distractible. She has poor awareness for bolus acceptance, limited oral manipulation, and intermittent oral holding. Delayed coughing was noted intermittently with thin liquids, but intake overall was too limited to make clear assessment. Eructation and transient sensation of nausea may be suggestive of a possible esophageal component. Recommend that pt remain NPO except for meds crushed in puree pending improvements in her mentation.   SLP Visit Diagnosis: Dysphagia, unspecified (R13.10)    Aspiration Risk  Moderate aspiration risk    Diet Recommendation NPO except meds   Medication Administration: Crushed with puree    Other  Recommendations Oral Care  Recommendations: Oral care QID Other Recommendations: Have oral suction available   Follow up Recommendations (tba)      Frequency and Duration min 2x/week  2 weeks       Prognosis Prognosis for Safe Diet Advancement: Good Barriers to Reach Goals: Cognitive deficits      Swallow Study   General HPI: Pt is an 84 yo female [facility] to [facility] bradycardia. CT Head negative. Found to have UTI. [facility] was noted to be coughing with thin liquids and pocketing food, therefore swallow evaluation was ordered. PMH: anxiety, afib, CHF, depression, gout, HTN, arthritis, PUD Type of Study: Bedside Swallow Evaluation Previous Swallow Assessment: none in chart Diet Prior to this Study: NPO Temperature Spikes Noted: No Respiratory Status: Room air History of Recent Intubation: No Behavior/Cognition: Alert;Distractible;Requires cueing Oral Cavity Assessment: Dried secretions(adhered to her soft palate) Oral Care Completed by SLP: Yes Oral Cavity - Dentition: Edentulous Self-Feeding Abilities: Total assist Patient Positioning: Upright in bed Baseline Vocal Quality: Normal    Oral/Motor/Sensory Function Overall Oral Motor/Sensory Function: (not following commands consistently but seems WFL)   Ice Chips Ice chips: Impaired Presentation: Spoon Oral Phase Impairments: Poor awareness of bolus Oral Phase Functional Implications: Oral holding Pharyngeal Phase Impairments: Suspected delayed Swallow   Thin Liquid Thin Liquid: Impaired Presentation: Spoon;Straw Oral Phase Impairments: Poor awareness of bolus Pharyngeal  Phase Impairments: Suspected delayed Swallow;Cough - Delayed    Nectar Thick Nectar Thick Liquid: Not tested   Honey Thick Honey Thick Liquid: Not tested   Puree  Puree: Impaired Presentation: Spoon Oral Phase Impairments: Poor awareness of bolus Oral Phase Functional Implications: Prolonged oral transit    Solid     Solid: Not tested      Maxcine Ham 12/06/2017,4:56 PM  Maxcine Ham, M.A. CCC-SLP [phone]

## 2017-11-23 NOTE — Consult Note (Signed)
Cardiology Consultation:   Patient ID: Taylor FlemingSara L Joshua; [id number]; Oct 13, 1928   Admit date: 11/28/2017 Date of Consult: 11/18/2017  Primary Care Provider: Patient, No Pcp Per Primary Cardiologist: Dr. Tomie Chinaevankar in 2017  Chief Complaint: fall, shoulder pain  Patient Profile:   Taylor Coleman is a 84 y.o. female with a hx of anemia, anxiety/depression, chronic atrial fib (not on anticoagulation due to h/o GIB 2018 and subdural hematoma 2012), falls, chronic diastolic CHF, CKD stage IV, peptic ulcer disease, HTN, arthritis who is being seen today for the evaluation of bradycardia at the request of Dr. Jerral RalphGhimire who transferred patient to Dr. Nydia BoutonArgarwala.  History of Present Illness:   Taylor FlemingSara L Pasillas was remotely seen by Dr. Tomie Chinaevankar for atrial fib and decision was made not to anticoagulate at that time. Records reviewed from [facility]. Apparently she came into the ER on 8/6 with possible mechanical fall vs syncope and had continued issues with chest/shoulder discomfort. She was noted to be bradycardic with HR 37 so was given 1/2 amp of atropine with improvement in HR to 58. There is mention she was given glucagon in case of beta blocker OD but no significant improvement in HR. EKG was reported to reveal atrial fib with prolonged QT greater than 500ms. There is also report of RBBB. Initial imaging unremarkable except an T2 endplate compression fx of unknown age.  She was subsequently admitted and developed recurrent symptomatic bradycardia and was started on an epinephrine infusion. On 8/6 because of improvement in HR, the cardiologist recommended to stop the medicine with close monitoring. Within 2 hours of stopping she developed bradycardia with HR in the 30s and became quite symptomatic. DC summary states, "She became aphasic and was unable to move her extremities. Her pupils were dilated. Code stroke was initiated. CT of the head was unremarkable." central line was placed and she was started on dopamine.  Appears neuro saw her and doubted any acute large vessel occlusion. Conversations were had with the family and decision was made to transport to [facility] for evaluation for pacemaker. Due to later nature in the day, general cardiology team was asked to consult. 2D echo at OSH per report showed EF >70%, pseudonormal LV filling pattern with elevated LA pressures, severely dilated LA, markedly enlarged RA, mild AS, mild-mod MR, mod-severe TR, mod pulm HTN. Labs showed Cr 2.5->2.8 with BUN 47 today, Hgb 11.3, plt 66 (no prior to compare to). K on admission 4.2, was 5.0 today. Troponin 0.02, A1C 6.6. She was felt to have a possible UTI but cultures were negative so abx were discontinued.  Further history was obtained by Dr. SwazilandJordan speaking with the family. Apparently she's had issues recently with high BP. The patient lives by herself and is generally able to function on her own. She doesn't do a whole lot but family lives next door (grandson) and checks in on her. She has a ramp outside her house to a chair she sits out in the yard. Daughter in law's mother says they got a call to go check on her. When they got over there, the patient had fallen at the bottom of the ramp but the patient isn't really clear on what happened. Normally they say she is mentally alert and with it, but she is agitated with mittens on without able to contribute any substantial history to the conversation. The granddaughter is POA and will be arriving tonight - Dr. SwazilandJordan said it would be helpful if she was present for conversations in  AM. No prior known syncope. The patient apparently never had chest pain or SOB. Family has noticed she's been "weaker" and hasn't been eating well recently.   Past Medical History:  Diagnosis Date  . Anemia   . Anxiety   . Chronic atrial fibrillation (HCC)    Not anticoagulated due to prior GI bleed.  . Chronic diastolic CHF (congestive heart failure) (HCC)   . CKD (chronic kidney disease), stage IV  (HCC)   . Depression   . Falls   . Gout due to renal impairment involving toe of left foot   . Hypertension   . Hypothyroidism   . Osteoarthritis   . Peptic ulcer disease   . Pulmonary hypertension (HCC)   . QT prolongation   . Subdural hematoma [facility])     Past Surgical History:  Procedure Laterality Date  . ABDOMINAL HYSTERECTOMY    . TUBAL LIGATION       Inpatient Medications: Scheduled Meds: . Chlorhexidine Gluconate Cloth  6 each Topical Daily  . enoxaparin (LOVENOX) injection  40 mg Subcutaneous Q12H  . mouth rinse  15 mL Mouth Rinse BID  . nicotine  7 mg Transdermal Daily  . sodium chloride flush  10-40 mL Intracatheter Q12H   Continuous Infusions: . sodium chloride 50 mL/hr at 11/22/2017 1707  . sodium chloride    . DOPamine 5 mcg/kg/min (11/29/2017 1750)   PRN Meds: acetaminophen, sodium chloride flush  Home Meds: Prior to Admission medications   Medication Sig Start Date End Date Taking? Authorizing Provider  enalapril (VASOTEC) 20 MG tablet Take 20 mg by mouth daily.      [provider]  furosemide (LASIX) 40 MG tablet Take 40 mg by mouth 2 (two) times daily.      [provider]  metoprolol (LOPRESSOR) 50 MG tablet Take 50 mg by mouth 2 (two) times daily.      [provider]  nitroGLYCERIN (NITROSTAT) 0.4 MG SL tablet Place 0.4 mg under the tongue every 5 (five) minutes as needed. For chest pains     [provider]  ranitidine (ZANTAC) 150 MG tablet Take 150 mg by mouth 2 (two) times daily.      [provider]    Allergies:    Allergies  Allergen Reactions  . Penicillins Rash    Social History:   Social History   Socioeconomic History  . Marital status: Single    Spouse name: Not on file  . Number of children: Not on file  . Years of education: Not on file  . Highest education level: Not on file  Occupational History  . Not on file  Social Needs  . Financial resource strain: Not on file  . Food  insecurity:    Worry: Not on file    Inability: Not on file  . Transportation needs:    Medical: Not on file    Non-medical: Not on file  Tobacco Use  . Smoking status: Former Games developer  . Smokeless tobacco: Current User    Types: Chew  Substance and Sexual Activity  . Alcohol use: No  . Drug use: No  . Sexual activity: Not Currently  Lifestyle  . Physical activity:    Days per week: Not on file    Minutes per session: Not on file  . Stress: Not on file  Relationships  . Social connections:    Talks on phone: Not on file    Gets together: Not on file    Attends  religious service: Not on file    Active member of club or organization: Not on file    Attends meetings of clubs or organizations: Not on file    Relationship status: Not on file  . Intimate partner violence:    Fear of current or ex partner: Not on file    Emotionally abused: Not on file    Physically abused: Not on file    Forced sexual activity: Not on file  Other Topics Concern  . Not on file  Social History Narrative  . Not on file    Family History:   The patient's family history is negative for Other. unable to obtain given pt's mental status  ROS:  Please see the history of present illness.  All other ROS reviewed and negative.     Physical Exam/Data:   Vitals:   11/18/2017 1556 11/26/2017 1600 11/18/2017 1700  BP: (!) 151/94 106/65 (!) 145/121  Pulse:  (!) 50 (!) 44  Resp:  17 16  Temp: 98.6 F (37 C)    TempSrc: Oral    SpO2:  95% 95%  Weight: 110 lb 0.2 oz (49.9 kg)      Intake/Output Summary (Last 24 hours) at 12/09/2017 1752 Last data filed at 11/29/2017 1556 Gross per 24 hour  Intake -  Output 150 ml  Net -150 ml   Filed Weights   11/22/2017 1556  Weight: 110 lb 0.2 oz (49.9 kg)   Body mass index is 19.49 kg/m.  General: Thin elderly F in no acute distress but agitated Head: Normocephalic, atraumatic, sclera non-icteric, no xanthomas, nares are without discharge. Edentulous Neck:  Negative for carotid bruits. JVD not elevated. Lungs: Clear bilaterally to auscultation without wheezes, rales, or rhonchi. Breathing is unlabored. Heart: Irregularly irregular with S1 S2. No murmurs, rubs, or gallops appreciated. Abdomen: Soft, non-tender, non-distended with normoactive bowel sounds. No hepatomegaly. No rebound/guarding. No obvious abdominal masses. Msk:  Strength and tone appear normal for age. Extremities: No clubbing or cyanosis. No edema.  Distal pedal pulses are 2+ and equal bilaterally. Neuro: somewhat confused, unable to provide adequate history Psych: Agitated.  EKG:  The EKG was personally reviewed and demonstrates atrial fib with HR 57bpm with left axis deviation, NSIVCD - QT prolonged by EKG report but visually appears less  Relevant CV Studies: As above  Laboratory Data:  See above  Radiology/Studies:  No results found.  Assessment and Plan:   1. Symptomatic bradycardia in context of chronic atrial fibrillation 2. Confusion following bradycardic event without evidence for acute CVA 3. ?AKI on CKD stage IV - baseline unknown 4. Thrombocytopenia, unclear baseline 5. Recent weakness/weight loss/poor appetite and possible failure to thrive 6. Chronic diastolic CHF with valvular disease outlined above 7. Possible QT prolongation, will be monitored  Patient seen and examined by Dr. Swaziland and we discussed assessment/plan together. The patient is currently stable on dopamine drip but remains confused. Granddaughter is POA and is not at bedside but will be arriving during the night. Family was encouraged to see if she can be here for further discussions in the morning. She has normal LV function and troponins so doubt other active ischemic issue. TSH mildly elevated but not to the degree that would perpetuate this degree of bradycardia given normal free hormones. Per cardiology note from today she has been off beta blockers for at least 24 hours. There was mention  of PRN clonidine in her admission notes for high BP at outside hospital but we do not  have record of her receiving this. Will have EP see in AM. Would probable hold off on Lovenox for now in case acute procedure is needed (DVT ppx dose is actually near full dose anticoag) - place SCDs.   For questions or updates, please contact [facility] Please consult [email or web address] for contact info under Cardiology/STEMI.    Signed, Laurann Montana, PA-C  12/17/2017 5:52 PM

## 2017-11-23 NOTE — Progress Notes (Signed)
[facility] PCCM PROGRESS NOTE  S: 84 year old female [facility]ope secondary to symptomatic bradycardia treated with epinephrine infusion. Transferred [facility] for EP/PPM evaluation. She was transitioned to dopamine infusion and was seen by cardiology who planned to maintain on dopamine and eval for PPM in AM. Unfortunately around 1900 she developed pulsatile VT eventually losing consciousness and requiring defibrillation. She never lost pulse per RN. Rhythm returned to atrial fib with slow ventricular response. Rates in the 40s to 60.  O: BP (!) 152/93   Pulse (!) 46   Temp 98.6 F (37 C) (Oral)   Resp 16   Ht 5\' 3"  (1.6 m)   Wt 49.9 kg (110 lb 0.2 oz)   SpO2 96%   BMI 19.49 kg/m   General:  Frail elderly female in mild distress.  Neuro:  Spontaneously awake, alert. Totally disoriented.  HEENT:  [place]/AT, PERRL, no JVD Cardiovascular:  IRIR, bradycardic. No edema.  Lungs:  Clear bilateral breath sounds Abdomen:  Soft, non-tender, non-distended Musculoskeletal:  No acute deformity or ROM limitation.  Skin:  New skin tear R forearm. Otherwise grossly intact.    A/P:  Symptomatic bradycardia: diagnosed after syncopal episode 8/5. Initially on ep[facility]phrine at [facility], but transitioned to dopamine and transferred to [facility] on 8/7 for EP eval for PPM.   -Continue telemetry monitoring. -Continue dopamine infusion for now. -Cardiology called, will re-evaluate.  Ventricular Tachycardia: Im concerned that dopamine could have contributed, although she has been on epi for the past couple days without issue. Mag also borderline low at 1.8. - Replace mag - Cardiology to see. May need to expedite pacemaker placement in order to minimize exposure to arrhythmogenic agents.  - POA en route to discuss further options.   My critical care time 25 minutes.   Joneen RoachPaul Masae Lukacs, ACNP [facility]/Critical Care Pager [phone] or [phone]

## 2017-11-23 NOTE — Progress Notes (Signed)
Patient went into ventricular tachycardia with rates in 220 at 1911. Initially asymptomatic, was talking and moving extremities, later became nonverbal and had fixed gaze. Code Blue was called. Patient was shocked x 1, never lost a pulse. Patient slowly came back around, was talking and moving all four extremities. Family at bedside, POA Marchelle Folksmanda on her way to hospital. Renae FicklePaul NP-CCM at bedside.

## 2017-11-23 NOTE — Progress Notes (Signed)
Responded to code blue page; extended family present; staff updated on her progress; pt calmed; extended family waiting for other family members to arrive-pt's grandson and daughter(healthcare pwr of attrny); asked if there was anything I could get for them; told them to have me paged if they or other family have any needs

## 2017-11-23 NOTE — Progress Notes (Addendum)
Called to see patient on 2H for VT arrest. She was admitted for symptomatic bradycardia, requiring epinephrine initially, then transitioned over to dopamine. She has underlying afib. Noted to go into polymorphic, then monomorphic VT this evening. Shocked back to a-fib. QTc was noted to be prolonged. Receiving IV magnesium. ?if dopamine is driving her VT - torsades less likely given monomorphic morphology. Currently she is awake, but not oriented to place, year or month. Granddaughter is POA, currently on her way in. Discussed with Dr. SwazilandJordan (who admitted her) and is the interventionalist on call tonight about temporary pacer wire. He is recommending that we try isoproterenol initially and consult EP tomorrow. Will switch dopamine to isuprel.  CRITICAL CARE TIME: I have spent a total of 35 minutes with patient reviewing hospital notes, telemetry, EKGs, labs and examining the patient as well as establishing an assessment and plan that was discussed with the patient. > 50% of time was spent in direct patient care pertaining to life-threatening ventricular tachycardia and hemodynamically significant bradycardia. The patient is critically ill with multi-organ system failure and requires high complexity decision making for assessment and support, frequent evaluation and titration of therapies, application of advanced monitoring technologies and extensive interpretation of multiple databases.  Chrystie NoseKenneth C. Carla Rashad, MD, [facility] CenterFACC, FACP  [facility]  Medical Director of the [facility] Lipid Disorders &  Cardiovascular Risk Reduction Clinic Diplomate of the American Board of Clinical Lipidology Attending Cardiologist  Direct Dial: [phone]  Fax: [phone]  Website:  [email or web address]

## 2017-11-24 ENCOUNTER — Encounter (HOSPITAL_COMMUNITY): Admission: AD | Disposition: E | Payer: Self-pay | Source: Other Acute Inpatient Hospital | Attending: Pulmonary Disease

## 2017-11-24 ENCOUNTER — Encounter (HOSPITAL_COMMUNITY): Payer: Self-pay

## 2017-11-24 ENCOUNTER — Other Ambulatory Visit: Payer: Self-pay

## 2017-11-24 LAB — BASIC METABOLIC PANEL
Anion gap: 17 — ABNORMAL HIGH (ref 5–15)
BUN: 50 mg/dL — AB (ref 8–23)
CHLORIDE: 106 mmol/L (ref 98–111)
CO2: 18 mmol/L — AB (ref 22–32)
CREATININE: 2.49 mg/dL — AB (ref 0.44–1.00)
Calcium: 9.7 mg/dL (ref 8.9–10.3)
GFR calc Af Amer: 19 mL/min — ABNORMAL LOW (ref >60)
GFR calc non Af Amer: 16 mL/min — ABNORMAL LOW (ref >60)
Glucose, Bld: 243 mg/dL — ABNORMAL HIGH (ref 70–99)
Potassium: 4.1 mmol/L (ref 3.5–5.1)
Sodium: 141 mmol/L (ref 135–145)

## 2017-11-24 LAB — CBC
HEMATOCRIT: 31.1 % — AB (ref 36.0–46.0)
Hemoglobin: 10 g/dL — ABNORMAL LOW (ref 12.0–15.0)
MCH: 31.7 pg (ref 26.0–34.0)
MCHC: 32.2 g/dL (ref 30.0–36.0)
MCV: 98.7 fL (ref 78.0–100.0)
PLATELETS: 64 10*3/uL — AB (ref 150–400)
RBC: 3.15 MIL/uL — AB (ref 3.87–5.11)
RDW: 14.2 % (ref 11.5–15.5)
WBC: 10 10*3/uL (ref 4.0–10.5)

## 2017-11-24 LAB — GLUCOSE, CAPILLARY
GLUCOSE-CAPILLARY: 257 mg/dL — AB (ref 70–99)
GLUCOSE-CAPILLARY: 180 mg/dL — AB (ref 70–99)
Glucose-Capillary: 171 mg/dL — ABNORMAL HIGH (ref 70–99)
Glucose-Capillary: 213 mg/dL — ABNORMAL HIGH (ref 70–99)
Glucose-Capillary: 178 mg/dL — ABNORMAL HIGH (ref 70–99)
Glucose-Capillary: 198 mg/dL — ABNORMAL HIGH (ref 70–99)
Glucose-Capillary: 153 mg/dL — ABNORMAL HIGH (ref 70–99)

## 2017-11-24 LAB — LACTIC ACID, PLASMA: Lactic Acid, Venous: 2.8 mmol/L (ref 0.5–1.9)

## 2017-11-24 SURGERY — PACEMAKER IMPLANT

## 2017-11-24 MED ORDER — FENTANYL CITRATE (PF) 100 MCG/2ML IJ SOLN
12.5000 ug | INTRAMUSCULAR | Status: DC | PRN
  Administered 2017-11-24 (×3): 12.5 ug via INTRAVENOUS
  Filled 2017-11-24 (×3): qty 2

## 2017-11-24 MED ORDER — SODIUM CHLORIDE 0.9 % IV SOLN: 250.0000 mL | INTRAVENOUS | Status: DC

## 2017-11-24 MED ORDER — SODIUM CHLORIDE 0.9% FLUSH: 3.0000 mL | Freq: Two times a day (BID) | INTRAVENOUS | Status: DC

## 2017-11-24 MED ORDER — VANCOMYCIN HCL IN DEXTROSE 1-5 GM/200ML-% IV SOLN
1000.0000 mg | INTRAVENOUS | Status: DC
  Filled 2017-11-24: qty 200

## 2017-11-24 MED ORDER — SODIUM CHLORIDE 0.9 % IV SOLN
80.0000 mg | INTRAVENOUS | Status: DC
  Filled 2017-11-24: qty 2

## 2017-11-24 MED ORDER — SODIUM CHLORIDE 0.9% FLUSH: 3.0000 mL | INTRAVENOUS | Status: DC | PRN

## 2017-11-24 MED ORDER — CHLORHEXIDINE GLUCONATE 4 % EX LIQD: 60.0000 mL | Freq: Once | CUTANEOUS | Status: DC

## 2017-11-24 MED ORDER — SODIUM CHLORIDE 0.9 % IV SOLN: INTRAVENOUS | Status: DC

## 2017-11-24 MED ORDER — FENTANYL CITRATE (PF) 100 MCG/2ML IJ SOLN
25.0000 ug | Freq: Once | INTRAMUSCULAR | Status: AC
  Administered 2017-11-24: 12.5 ug via INTRAVENOUS
  Filled 2017-11-24: qty 2

## 2017-11-24 NOTE — Progress Notes (Signed)
  Speech Language Pathology Treatment: Dysphagia  Patient Details Name: Taylor Coleman MRN: [id number] DOB: Nov 24, 1928 Today's Date: 12/08/2017 Time: [id number] SLP Time Calculation (min) (ACUTE ONLY): 22 min  Assessment / Plan / Recommendation Clinical Impression  Pt remains quite delirious, but calm, redirectable with max visual and contextual cues. Responsive to hand over hand assist with straw sips of ensure and water. No coughing, no belching observed. Potential for aspiration events persists given mentation, which waxes and wanes. Will initiate a puree/thin liquid diet with RN today and monitor for tolerance.   HPI HPI: Pt is an 84 yo female admitted [facility] s/p fall and [facility] due to persistent bradycardia. CT Head negative. Found to have UTI. At [facility] she was noted to be coughing with thin liquids and pocketing food, therefore swallow evaluation was ordered. PMH: anxiety, afib, CHF, depression, gout, HTN, arthritis, PUD      SLP Plan  Continue with current plan of care       Recommendations  Diet recommendations: Dysphagia 1 (puree);Thin liquid Liquids provided via: Straw Medication Administration: Crushed with puree Supervision: Full supervision/cueing for compensatory strategies Compensations: Slow rate;Small sips/bites(hand over hand assist) Postural Changes and/or Swallow Maneuvers: Seated upright 90 degrees                Oral Care Recommendations: Oral care BID Follow up Recommendations: Skilled Nursing facility SLP Visit Diagnosis: Dysphagia, unspecified (R13.10) Plan: Continue with current plan of care       GO               [facility] LLCBonnie Jessice Madill, MA CCC-SLP [phone]  Claudine MoutonDeBlois, [name] 11/28/2017, 9:29 AM

## 2017-11-24 NOTE — Consult Note (Addendum)
Cardiology Consultation:   Patient ID: Taylor Coleman; [id number]; 02-13-1929   Admit date: 12/03/2017 Date of Consult: 11-25-2017  Primary Care Provider: Patient, No Pcp Per Primary Cardiologist: Dr. Tomie China Primary Electrophysiologist: new   Patient Profile:   Taylor Coleman is a 84 y.o. female with a hx ofpermanent AFib, anxiety/depression, unsteady gait/falls, hx of GIB and SDH given these issues not on a/c, chronic CHF (diastolic), CKD (IV), HTN, arthritis who is being seen today for the evaluation of symptomatic bradycardia at the request of Dr. Swaziland.  History of Present Illness:   HPI is obtained entirely from the chart, patient is altered, no family at bedside:  Taylor Coleman prior to the hospitalization is reported to be living independently with close family support, on the day of her admission to [facility] she was found on the ground unknown if she had a mechanical fall or a syncopal event.  Cardiology consult notes: "Apparently she came into the ER on 8/6 with possible mechanical fall vs syncope and had continued issues with chest/shoulder discomfort. She was noted to be bradycardic with HR 37 so was given 1/2 amp of atropine with improvement in HR to 58. There is mention she was given glucagon in case of beta blocker OD but no significant improvement in HR. EKG was reported to reveal atrial fib with prolonged QT greater than . There is also report of RBBB. Initial imaging unremarkable except an T2 endplate compression fx of unknown age.  She was subsequently admitted and developed recurrent symptomatic bradycardia and was started on an epinephrine infusion. On 8/6 because of improvement in HR, the cardiologist recommended to stop the medicine with close monitoring. Within 2 hours of stopping she developed bradycardia with HR in the 30s and became quite symptomatic. DC summary states, "She became aphasic and was unable to move her extremities. Her pupils were dilated. Code  stroke was initiated. CT of the head was unremarkable." central line was placed and she was started on dopamine. Appears neuro saw her and doubted any acute large vessel occlusion. Conversations were had with the family and decision was made to transport to [facility] for evaluation for pacemaker. Due to later nature in the day, general cardiology team was asked to consult. 2D echo at OSH per report showed EF >70%, pseudonormal LV filling pattern with elevated LA pressures, severely dilated LA, markedly enlarged RA, mild AS, mild-mod MR, mod-severe TR, mod pulm HTN. Labs showed Cr 2.5->2.8 with BUN 47 today, Hgb 11.3, plt 66 (no prior to compare to). K on admission 4.2, was 5.0 today. Troponin 0.02, A1C 6.6. She was felt to have a possible UTI but cultures were negative so abx were discontinued.  Further history was obtained by Dr. Swaziland speaking with the family. Apparently she's had issues recently with high BP. The patient lives by herself and is generally able to function on her own. She doesn't do a whole lot but family lives next door (grandson) and checks in on her. She has a ramp outside her house to a chair she sits out in the yard. Daughter in law's mother says they got a call to go check on her. When they got over there, the patient had fallen at the bottom of the ramp but the patient isn't really clear on what happened."  Last night the patient had VT arrest requiring shock, restoring her back to AFib, CVR.  She was taken off dopaine in-case this was triggerring V arrhythmias and changed to Isuprel.  BP looks to have remained stable, she remains in AFib 60;s, with non-sustained runs of Wide complex beats, rates about 100bpm that are concerning for slower VT.   LABS 11/21/17: K+ 4.2 BUN/Creat 38/2.60 WBC 6.3 H/H 12/36 Plts 76 Trop I 0.02  Today: K+ 4.1 BUN/Creat 50/2.49 Yesterday mag 1.8 plts 64   Past Medical History:  Diagnosis Date  . Anemia   . Anxiety   . Chronic atrial  fibrillation (HCC)    Not anticoagulated due to prior GI bleed.  . Chronic diastolic CHF (congestive heart failure) (HCC)   . CKD (chronic kidney disease), stage IV (HCC)   . Depression   . Falls   . Gout due to renal impairment involving toe of left foot   . Hypertension   . Hypothyroidism   . Osteoarthritis   . Peptic ulcer disease   . Pulmonary hypertension (HCC)   . QT prolongation   . Subdural hematoma [facility](HCC)     Past Surgical History:  Procedure Laterality Date  . ABDOMINAL HYSTERECTOMY    . TUBAL LIGATION       Home Medications:  Prior to Admission medications   Medication Sig Start Date End Date Taking? Authorizing Provider  enalapril (VASOTEC) 20 MG tablet Take 20 mg by mouth daily.      [provider]  furosemide (LASIX) 40 MG tablet Take 40 mg by mouth 2 (two) times daily.      [provider]  metoprolol (LOPRESSOR) 50 MG tablet Take 50 mg by mouth 2 (two) times daily.      [provider]  nitroGLYCERIN (NITROSTAT) 0.4 MG SL tablet Place 0.4 mg under the tongue every 5 (five) minutes as needed. For chest pains     [provider]  ranitidine (ZANTAC) 150 MG tablet Take 150 mg by mouth 2 (two) times daily.      [provider]    Inpatient Medications: Scheduled Meds: . Chlorhexidine Gluconate Cloth  6 each Topical Daily  . mouth rinse  15 mL Mouth Rinse BID  . nicotine  7 mg Transdermal Daily  . sodium chloride flush  10-40 mL Intracatheter Q12H   Continuous Infusions: . sodium chloride Stopped (12/15/2017 2225)  . sodium chloride    . isoproterenol (ISUPREL) infusion 1 mcg/min (11-04-2017 0800)   PRN Meds: acetaminophen, Melatonin, sodium chloride flush  Allergies:    Allergies  Allergen Reactions  . Penicillins Rash    Social History:   Social History   Socioeconomic History  . Marital status: Single    Spouse name: Not on file  . Number of children: Not on file  . Years of education: Not on file  .  Highest education level: Not on file  Occupational History  . Not on file  Social Needs  . Financial resource strain: Not on file  . Food insecurity:    Worry: Not on file    Inability: Not on file  . Transportation needs:    Medical: Not on file    Non-medical: Not on file  Tobacco Use  . Smoking status: Former Games developermoker  . Smokeless tobacco: Current User    Types: Chew  Substance and Sexual Activity  . Alcohol use: No  . Drug use: No  . Sexual activity: Not Currently  Lifestyle  . Physical activity:    Days per week: Not on file    Minutes per session: Not on file  . Stress: Not on file  Relationships  . Social connections:  Talks on phone: Not on file    Gets together: Not on file    Attends religious service: Not on file    Active member of club or organization: Not on file    Attends meetings of clubs or organizations: Not on file    Relationship status: Not on file  . Intimate partner violence:    Fear of current or ex partner: Not on file    Emotionally abused: Not on file    Physically abused: Not on file    Forced sexual activity: Not on file  Other Topics Concern  . Not on file  Social History Narrative  . Not on file    Family History:   Family History  Problem Relation Age of Onset  . Other Neg Hx        Patient has been confused     ROS:  Unable to obtain w/AMS    Physical Exam/Data:   Vitals:   12/03/2017 0705 11/20/2017 0834 12/06/2017 0835 12/08/2017 0841  BP: (!) 154/80  (!) 131/53   Pulse:      Resp: (!) 23 (!) 22 (!) 28   Temp:    (!) 97.4 F (36.3 C)  TempSrc:    Axillary  SpO2:   100%   Weight:      Height:        Intake/Output Summary (Last 24 hours) at 11/27/2017 0906 Last data filed at 11/27/2017 0900 Gross per 24 hour  Intake 747.21 ml  Output 150 ml  Net 597.21 ml   Filed Weights   2017-12-07 1556  Weight: 49.9 kg   Body mass index is 19.49 kg/m.  General:  This, frail body habitus, confused, moving about in the bed, but does  not appear in physical distress. HEENT: normal Lymph: no adenopathy Neck: no JVD Endocrine:  Not assessed Vascular: No carotid bruits appreciated, difficult to assess  Cardiac:  irreg-irreg, no obvious/significant murmurs appreciated; no gallops or rubs Lungs:  CTA b/l with normal respirations, no wheezing, rhonchi or rales  Abd: soft, nontender Ext: no edema Musculoskeletal:  No deformities, advanced/age appropriate atrophy Skin: warm and dry  Neuro:  Moving all extremities, tells me her name, answers no to pain, mumbling otherwise, tells me she is at [facility]:  Confused all night, remains very restless in bed, and agitated intermittently, reported to have tried to hit night RN   EKG:  The EKG was personally reviewed and demonstrates:   [facility] EKGs not available for review #1 Afib 57bpm, , poor R progression, no ST changes prlonged QT #2AFib 44bpm, motion limits Telemetry:  Telemetry was personally reviewed and demonstrates:   AFib 60's, she is having short runs of wide complex beats, these are slower then her VT episodes, though regular and worrisome for V etiology  Relevant CV Studies:  As mentioned above  Laboratory Data:  Chemistry Recent Labs  Lab 07-Dec-2017 1752 11/18/2017 0451  NA 141 141  K 4.8 4.1  CL 104 106  CO2 22 18*  GLUCOSE 153* 243*  BUN 46* 50*  CREATININE 2.44* 2.49*  CALCIUM 9.8 9.7  GFRNONAA 17* 16*  GFRAA 20* 19*  ANIONGAP 15 17*    Recent Labs  Lab 2017/12/07 1752  PROT 6.6  ALBUMIN 3.6  AST 84*  ALT 29  ALKPHOS 59  BILITOT 1.9*   Hematology Recent Labs  Lab 12/07/2017 1752 11/17/2017 0451  WBC 11.6* 10.0  RBC 3.37* 3.15*  HGB 10.5* 10.0*  HCT 32.9*  31.1*  MCV 97.6 98.7  MCH 31.2 31.7  MCHC 31.9 32.2  RDW 14.1 14.2  PLT 70* 64*   Cardiac EnzymesNo results for input(s): TROPONINI in the last 168 hours. No results for input(s): TROPIPOC in the last 168 hours.  BNP Recent Labs  Lab 11/29/2017 1757  BNP 2,272.4*      DDimer No results for input(s): DDIMER in the last 168 hours.  Radiology/Studies:   Portable Chest X-ray 1 View Result Date: 11/25/2017 CLINICAL DATA:  Bradycardia. EXAM: PORTABLE CHEST 1 VIEW COMPARISON:  Chest radiograph November 22, 2017 FINDINGS: Stable cardiomegaly. Calcified aortic arch. Mild chronic interstitial changes without pleural effusion or focal consolidation. Persistently elevated RIGHT hemidiaphragm. RIGHT internal jugular central venous catheter distal tip projects in mid superior vena cava. No pneumothorax. Osteopenia. Coarse calcifications LEFT breast. IMPRESSION: Stable cardiomegaly. No acute pulmonary process. Stable appearance of RIGHT IJ line. Aortic Atherosclerosis (ICD10-I70.0). Electronically Signed   By: Awilda Metro M.D.   On: 12/01/2017 19:45    Assessment and Plan:   1. AFib w/SVR 2. Symptomatic bradycardia w/suspect syncope vs fall     Reportedly off he BB >48hours prior to her transfer to here 3. VT arrest     Possibly dopamine, long-short     She has had nonsustained wide complex beats that are suspect to be slower NSVT  Dr. Elberta Fortis has seen and examined the patient early this morning, reduced her isuprel dose to 1, in effort to minimize V ectopy and maintain HR support. The patient is oriented only to self, has been confused all night, and intermittently very agitated, and moving around in the bed, trying to get up.  With her current mental status, and inability to keep her still, she would require general anesthesia No family is available, have asked nursing to let EP service know when they arrive to discuss.  POA it seems is a grand daughter, I am not sure she has been in-house yet.  4. AMS     Reportedly AAO x4 at baseline     CT at [place] without acute findings 5. Acute kidney injury (unknown baseline) 6. Thrombocytopenia (unknown baseline)  We have asked that CCM team manage her delerium, AMS.  Nicolaos Mitrano ned to avoid QT prolonging agents     For  questions or updates, please contact [facility] Please consult [email or web address] for contact info under Cardiology/STEMI.   Signed, Sheilah Pigeon, PA-C  12-05-17 9:06 AM  I have seen and examined this patient with Francis Dowse.  Agree with above, note added to reflect my findings.  On exam, iRRR, no murmurs, lungs clear.  Patient admitted to the hospital with severe conduction system disease.  She has atrial fibrillation which is chronic and has been severely bradycardic as well.  She was put on dopamine, but had an episode of prolonged ventricular tachycardia started out polymorphic but quickly went to monomorphic.  She required cardioversion for this.  She was switched to isoproterenol and has had no further prolonged episodes of ventricular arrhythmias.  In review of her EKG, she does have atrial fibrillation with a prolonged QTC.  It is likely that she had VT from her prolonged QTC and bradycardia.  She has had wide-complex tachycardias that have been short-lived on isoproterenol, though it is difficult to determine if this is due to ventricular tachycardia or aberrancy.  She is also quite delirious.  She is rolling around in the bed.  Unfortunately we need to avoid QT prolonging medicines due to her  prolonged QTC and her ventricular arrhythmias.  Plan per critical care.  I did give her some fentanyl which seemed to improve her symptoms.  Her platelets are low at 64 and she is in renal failure with a creatinine of 2.5.  She is currently getting hydrated.  She is a very high risk for bleeding and further renal failure, as well as lead dislodgment should she have a pacemaker implanted due to her delirium.  We Buell Parcel plan to have a family discussion once her family arrives this afternoon.  45 minutes of critical care time was spent with this patient.  Ethen Bannan M. Robert Sperl MD 12-22-17 11:04 AM

## 2017-11-24 NOTE — Progress Notes (Signed)
Had a family discussion with the patient, granddaughter, nephew and others. Discussed pacemaker implant including risks and benefits, included kidney failure and pneumothorax. She has been quite agitated which makes pneumothorax more likely. Also discussed the option of not implanting the pacemaker and changing goals of care. The family had a further discussion with the patient and have concluded that comfort measures are more appropriate at this time. The patient does not wish to have a pacemaker implanted. At this point, would change goals of care to comfort measures. The family and patient seem to be content with the decision made at this time. Have called palliative. EP to sign off of care. Please do not hesitate to call back with questions.  Layloni Fahrner Elberta Fortisamnitz, MD 12/05/2017 3:19 PM

## 2017-11-24 NOTE — Progress Notes (Addendum)
Given discussion noted with Dr. Elberta Fortisamnitz and the patient's family, I had further discussion with the patient's grand daughter, Marchelle Folksmanda who is the patient's POA and her brother.  They tell me the patient (and their grandfather) raised them.  They mention that the patient would not want any life prolonging measures in the event of a cardiac, respiratory arrest. I discussed specifically code status.  They do not want CPR, intubation or ACLS  in the event of a code.  They would like some additional time to allow more family to arrive, and we have consulted palliative care to aid in transitioning to CMO status.  Will leave the Isuprel for now.  Francis Dowseenee Idalee Foxworthy, PA-C   ADDEND: No call back from palliative care.  I have asked the RN to reach out to attending MD going forward for further management.  EP service will sign off, though remain available.  Please recall if needed.  Francis Dowseenee Chisa Kushner, PA-C

## 2017-11-24 NOTE — Progress Notes (Signed)
CRITICAL VALUE ALERT  Critical Value: Lactic acid 2.8  Date & Time Notied: 11/24/17 @11 :45am   Provider Notified: Canary BrimBrandi Ollis, Np

## 2017-11-24 NOTE — Progress Notes (Addendum)
Name: Taylor FlemingSara L Eddinger MRN: [id number] DOB: Mar 18, 1929    ADMISSION DATE:  12/03/2017 CONSULTATION DATE:  11/19/2017  [facility]   CHIEF COMPLAINT:  Fall, Bradycardia    BRIEF SUMMARY:  84 y/o F [facility] suffering [facility].  Work up notable for confusion and bradycardia.  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  Home medications were held (including metoprolol which had recently been increased).  Despite being off medications and dopamine infusion, she remained bradycardic.  On 8/7 pm, she had an episode of VT requiring defibrillation.    SUBJECTIVE: RN reports pt had defibrillation overnight for VT.  No compressions.VSS since.  Has had agitated delirium.    VITAL SIGNS: Temp:  [97.4 F (36.3 C)-99.2 F (37.3 C)] 97.4 F (36.3 C) (08/08 0841) Pulse Rate:  [37-126] 126 (08/08 0700) Resp:  [12-29] 28 (08/08 0835) BP: (86-189)/(42-139) 131/53 (08/08 0835) SpO2:  [90 %-100 %] 100 % (08/08 0835) Weight:  [49.9 kg] 49.9 kg (08/07 1556)  PHYSICAL EXAMINATION: General:  Frail elderly female lying in bed in NAD Neuro:  Awakens to voice, speech clear but not oriented  HEENT:  MM pink/dry, oral care performed  Cardiovascular:  s1s2 irr irr, bradycardia on monitor Lungs:  Even/non-labored, lungs bilaterally clear  Abdomen:  Obese/soft, non-tender, bsx4 active  Musculoskeletal:  No acute deformities  Skin:  Thin skin, warm/dry, no edema  Recent Labs  Lab 11/25/2017 1752 June 30, 2017 0451  NA 141 141  K 4.8 4.1  CL 104 106  CO2 22 18*  BUN 46* 50*  CREATININE 2.44* 2.49*  GLUCOSE 153* 243*    Recent Labs  Lab 12/16/2017 1752 June 30, 2017 0451  HGB 10.5* 10.0*  HCT 32.9* 31.1*  WBC 11.6* 10.0  PLT 70* 64*    Portable Chest X-ray 1 View  Result Date: 12/01/2017 CLINICAL DATA:  Bradycardia. EXAM: PORTABLE CHEST 1 VIEW COMPARISON:  Chest  radiograph November 22, 2017 FINDINGS: Stable cardiomegaly. Calcified aortic arch. Mild chronic interstitial changes without pleural effusion or focal consolidation. Persistently elevated RIGHT hemidiaphragm. RIGHT internal jugular central venous catheter distal tip projects in mid superior vena cava. No pneumothorax. Osteopenia. Coarse calcifications LEFT breast. IMPRESSION: Stable cardiomegaly. No acute pulmonary process. Stable appearance of RIGHT IJ line. Aortic Atherosclerosis (ICD10-I70.0). Electronically Signed   By: Awilda Metroourtnay  Bloomer M.D.   On: 11/19/2017 19:45    SIGNIFICANT EVENTS  8/07  Admit   STUDIES   CULTURES   ANTIBIOTICS     ASSESSMENT / PLAN:  Discussion:  84 y/o F admitted 8/7 after suffering a fall at home.  Recent increase in metoprolol as outpatient.  Work up concerning for sick sinus syndrome / bradycardia.  She continued to have bradycardia >48 hours off metoprolol and on dopamine infusion. Hospital course complicated by AKI on CKD.     Sick Sinus Syndrome with Syncopal Episode  VT - episode of pulsatile VT / altered LOC 8/7 pm s/p defibrillation P: Cardiology following, appreciate input  Hold home lopressor Pacemaker placement discussion ongoing, timing per Cardiology  Continue Isuprel    Mechanical Fall - in setting of sick sinus, bradycardia  P: PT evaluation    Chronic Atrial Fibrillation P: Tele monitoring  ASA    CKD IV  Anion Gap - no ABG to confirm acidosis, ? Uremia/AKI.  CXR clear/no evidence for infection, ? Lactic acidosis with hypoperfusion Hypomagnesemia P:  Trend BMP / urinary output Replace electrolytes as indicated Avoid nephrotoxic agents, ensure adequate renal perfusion Assess lactic acid    Anemia  Thrombocytopenia  P: Trend CBC  Monitor for bleeding    Agitated Delirium  ? Underlying Dementia  P: Limit sedating medications  Frequent re-orientation Lights on during day Promote sleep / wake cycle    Canary Brim, NP-C [facility] Pgr: [phone] or if no answer [phone] 12-10-17, 8:58 AM  Attestation Intensivist. This elderly woman presented with symptomativ bradycardia. Had apparently benen on a beta blocker Presently on Isuprel. Intermittently agitated so not an ideal candidate for a PPM. remians on small dose of Isuprel at this time. EP is waiting to see if the patient will need a PPM>

## 2017-11-24 NOTE — Evaluation (Addendum)
Physical Therapy Evaluation Patient Details Name: Taylor Coleman MRN: [id number] DOB: 1928/04/23 Today's Date: 11/18/2017   History of Present Illness  84 yo admitted with fall and symptomatic bradycardia to 30. PMhx: chronic T2 compression fx, microvascular strokes, anxiety, CHF, gout, HTN  Clinical Impression  Pt restless in bed with bil mittens on with HR highly varied from 50-74 supine in bed. Per RN pt moving all over the bed today and difficult to keep in one position. Pt able to respond to questions regarding home and stating she feels like she need to vomit and poop. Pt able to follow single step commands inconsistently with multimodal cues with pt internally distracted. Pt with HR 60-119 during session again with highly variable rate. Pt limited by cognition, decreased balance, transfers, function and cardiopulmonary status who will benefit from acute therapy to maximize mobility, safety and cognition to decrease burden of care pending cardiopulmonary function.      Follow Up Recommendations SNF;Supervision/Assistance - 24 hour    Equipment Recommendations  Other (comment)(TBD)    Recommendations for Other Services       Precautions / Restrictions Precautions Precautions: Fall Precaution Comments: bil mittens, as of eval orders for bed with bathroom privledges      Mobility  Bed Mobility Overal bed mobility: Needs Assistance Bed Mobility: Supine to Sit;Sit to Supine;Rolling Rolling: Min assist   Supine to sit: Mod assist;+2 for safety/equipment Sit to supine: Max assist   General bed mobility comments: pt able to assist with pivoting to EOB to elevate trunk. In sitting pt with heavy posterior lean and at times pushing posteriorly. Returned to supine with max assist to bring legs onto surface. Total +2 to scoot to [facility]  Transfers Overall transfer level: Needs assistance   Transfers: Sit to/from Stand Sit to Stand: Max assist         General transfer comment:  attempted stand x 2 with pt able to elevate sacrum with max assist and knees blocked but maintaining posterior lean and could not transfer or maintain significant time in standing despite pt stating need to toilet.   Ambulation/Gait             General Gait Details: unable  Stairs            Wheelchair Mobility    Modified Rankin (Stroke Patients Only)       Balance Overall balance assessment: Needs assistance   Sitting balance-Leahy Scale: Zero Sitting balance - Comments: posterior lean with periodic posterior pushing Postural control: Posterior lean                                   Pertinent Vitals/Pain Pain Assessment: (PAINAD= 2) Faces Pain Scale: Hurts a little bit Pain Location: generalized Pain Intervention(s): Repositioned    Home Living Family/patient expects to be discharged to:: Private residence Living Arrangements: Alone Available Help at Discharge: Family;Available PRN/intermittently Type of Home: House Home Access: Stairs to enter   Entrance Stairs-Number of Steps: 1 Home Layout: One level Home Equipment: Bedside commode Additional Comments: pt able to state she lives alone, no real stairs in house and cares for herself. Home setup taken from prior admission    Prior Function Level of Independence: Needs assistance   Gait / Transfers Assistance Needed: per pt independent but cane in room and unsure of reliability given confusion  ADL's / Homemaking Assistance Needed: family does the shopping and she performs limited  cooking        Hand Dominance        Extremity/Trunk Assessment   Upper Extremity Assessment Upper Extremity Assessment: Generalized weakness;Difficult to assess due to impaired cognition    Lower Extremity Assessment Lower Extremity Assessment: Generalized weakness;Difficult to assess due to impaired cognition    Cervical / Trunk Assessment Cervical / Trunk Assessment: Kyphotic  Communication       Cognition Arousal/Alertness: Lethargic Behavior During Therapy: Restless;Impulsive Overall Cognitive Status: No family/caregiver present to determine baseline cognitive functioning Area of Impairment: Orientation;Attention;Memory;Following commands;Safety/judgement                 Orientation Level: Disoriented to;Time;Situation;Place Current Attention Level: Focused Memory: Decreased short-term memory Following Commands: Follows one step commands inconsistently Safety/Judgement: Decreased awareness of deficits;Decreased awareness of safety     General Comments: per nurse acute confusion is not baseline. Pt not oriented stating year as 1920      General Comments      Exercises     Assessment/Plan    PT Assessment Patient needs continued PT services  PT Problem List Decreased strength;Decreased mobility;Decreased safety awareness;Decreased activity tolerance;Decreased balance;Decreased knowledge of use of DME;Decreased cognition;Cardiopulmonary status limiting activity       PT Treatment Interventions DME instruction;Therapeutic activities;Cognitive remediation;Gait training;Therapeutic exercise;Patient/family education;Balance training;Functional mobility training;Neuromuscular re-education    PT Goals (Current goals can be found in the Care Plan section)  Acute Rehab PT Goals PT Goal Formulation: Patient unable to participate in goal setting Time For Goal Achievement: 12/08/17 Potential to Achieve Goals: Fair    Frequency Min 2X/week   Barriers to discharge Decreased caregiver support      Co-evaluation               AM-PAC PT "6 Clicks" Daily Activity  Outcome Measure Difficulty turning over in bed (including adjusting bedclothes, sheets and blankets)?: A Lot Difficulty moving from lying on back to sitting on the side of the bed? : Unable Difficulty sitting down on and standing up from a chair with arms (e.g., wheelchair, bedside commode, etc,.)?:  Unable Help needed moving to and from a bed to chair (including a wheelchair)?: Total Help needed walking in hospital room?: Total Help needed climbing 3-5 steps with a railing? : Total 6 Click Score: 7    End of Session Equipment Utilized During Treatment: Gait belt Activity Tolerance: Other (comment)(limited by cognition and balance) Patient left: in bed;with call bell/phone within reach;with bed alarm set Nurse Communication: Mobility status;Need for lift equipment PT Visit Diagnosis: Other abnormalities of gait and mobility (R26.89);Muscle weakness (generalized) (M62.81);Unsteadiness on feet (R26.81)    Time: 1005-1020 PT Time Calculation (min) (ACUTE ONLY): 15 min   Charges:   PT Evaluation $PT Eval Moderate Complexity: 1 [address]Mod          Gearald Stonebraker Tabor Salvadore Valvano, PT [phone]   Enedina FinnerMaija B Rakeem Colley 12/12/2017, 12:23 PM

## 2017-11-25 DIAGNOSIS — Z515 Encounter for palliative care: Secondary | ICD-10-CM

## 2017-11-25 DIAGNOSIS — Z7189 Other specified counseling: Secondary | ICD-10-CM

## 2017-11-25 LAB — BASIC METABOLIC PANEL
Anion gap: 12 (ref 5–15)
BUN: 50 mg/dL — ABNORMAL HIGH (ref 8–23)
CHLORIDE: 109 mmol/L (ref 98–111)
CO2: 21 mmol/L — AB (ref 22–32)
CREATININE: 1.91 mg/dL — AB (ref 0.44–1.00)
Calcium: 9.6 mg/dL (ref 8.9–10.3)
GFR calc Af Amer: 26 mL/min — ABNORMAL LOW (ref >60)
GFR calc non Af Amer: 23 mL/min — ABNORMAL LOW (ref >60)
GLUCOSE: 133 mg/dL — AB (ref 70–99)
Potassium: 4.7 mmol/L (ref 3.5–5.1)
Sodium: 142 mmol/L (ref 135–145)

## 2017-11-25 LAB — CBC
HCT: 30.8 % — ABNORMAL LOW (ref 36.0–46.0)
HEMOGLOBIN: 9.8 g/dL — AB (ref 12.0–15.0)
MCH: 31.6 pg (ref 26.0–34.0)
MCHC: 31.8 g/dL (ref 30.0–36.0)
MCV: 99.4 fL (ref 78.0–100.0)
Platelets: 65 10*3/uL — ABNORMAL LOW (ref 150–400)
RBC: 3.1 MIL/uL — ABNORMAL LOW (ref 3.87–5.11)
RDW: 14.6 % (ref 11.5–15.5)
WBC: 8.6 10*3/uL (ref 4.0–10.5)

## 2017-11-25 LAB — GLUCOSE, CAPILLARY
GLUCOSE-CAPILLARY: 199 mg/dL — AB (ref 70–99)
GLUCOSE-CAPILLARY: 190 mg/dL — AB (ref 70–99)
Glucose-Capillary: 122 mg/dL — ABNORMAL HIGH (ref 70–99)
Glucose-Capillary: 143 mg/dL — ABNORMAL HIGH (ref 70–99)

## 2017-11-25 LAB — MAGNESIUM: Magnesium: 2.4 mg/dL (ref 1.7–2.4)

## 2017-11-25 LAB — PHOSPHORUS: Phosphorus: 2.3 mg/dL — ABNORMAL LOW (ref 2.5–4.6)

## 2017-11-25 MED ORDER — SODIUM PHOSPHATES 45 MMOLE/15ML IV SOLN
10.0000 mmol | Freq: Once | INTRAVENOUS | Status: AC
  Administered 2017-11-25: 10 mmol via INTRAVENOUS
  Filled 2017-11-25: qty 3.33

## 2017-11-25 NOTE — Consult Note (Signed)
Consultation Note Date: 11/25/2017   Patient Name: Taylor Coleman  DOB: April 03, 1929  MRN: [id number]  Age / Sex: 84 y.o., female  PCP: Patient, No Pcp Per Referring Physician: Kipp Brood, MD  Reason for Consultation: Establishing goals of care and Psychosocial/spiritual support  HPI/Patient Profile: 84 y.o. female  with past medical history of chronic compression fracture at T2, CVA, congestive heart failure, hypertension, atrial fib admitted on 11/21/2017 after a fall.  Patient was admitted initially to [facility] but then was subsequently transferred to [facility] secondary to bradycardia, heart rate in the 30s.  Her lactic acid level was 2.8.  Per chart review, family leaning towards comfort care and have declined a pacemaker  Consult ordered for end-of-life discussion and transition to comfort care, goals of care.   Clinical Assessment and Goals of Care: Patient seen, chart reviewed.  Met with patient's granddaughter Taylor Coleman who is healthcare power of attorney, her other granddaughter Taylor Coleman as well as grandson Taylor Coleman.  They show good insight into how ill their grandmother is and are respectful of her wishes not to have a pacemaker as well as DNR/DNI.  They have had positive experiences with hospice but as they see Taylor Coleman getting more alert they are reluctant to choose hospice route initially.  Additionally, Taylor Coleman, lives alone and her family has to work and she does not have 24/7 caregivers.  Taylor Coleman, granddaughter, is her healthcare power of attorney.  Taylor Coleman can be reached at [phone]  Mrs. Taylor Coleman had 2 children, 1 of whom is deceased.  She does have a surviving daughter (this is Taylor Coleman's mother).  She is a widow  We discussed different levels of hospice specifically hospice support in the home (which could be in a facility) as well as residential hospice for those with   Potentially a prognosis of 2 weeks or less.  At this particular juncture, I am not sure that she would meet residential hospice criteria which I did share with family.  They are also reluctant to go this route for fear of depriving Taylor Coleman of hope    SUMMARY OF RECOMMENDATIONS   Confirmed DNR DNI Confirmed no pacemaker Treat the treatable and when medically maximized discharge to [facility] for rehab with palliative medicine to follow in the facility To access  palliative medicine resources in the community,  Taylor Coleman at [phone] or [facility] palliative medicine division at Taylor Coleman:  DNR    Symptom Management:   Pain: Continue with fentanyl 12.5 mcg every hour as needed  Delirium: Continue with melatonin 3 mg nightly  Dyspnea: Continue with low-dose opioids, fentanyl 12.5 mcg every hour as needed.  Monitor for need for scheduled dosing  Palliative Prophylaxis:   Aspiration, Bowel Regimen, Delirium Protocol, Eye Care, Frequent Pain Assessment, Oral Care and Turn Reposition  Additional Recommendations (Limitations, Scope, Preferences):  Avoid Hospitalization, Minimize Medications, Initiate Comfort Feeding, No Artificial Feeding, No Chemotherapy, No Hemodialysis, No Radiation, No Surgical  Procedures and No Tracheostomy  Psycho-social/Spiritual:   Desire for further Chaplaincy support:no  Additional Recommendations: Referral to Community Resources   Prognosis:   < 6 months in the setting of congestive heart failure with new onset of bradycardia, atrial fib, hypertension, history of stroke.  Patient does not wish to pursue a pacemaker.  She would qualify for her hospice in-home benefit but family is electing rehab, however brief, as first initial step and will then transfer to hospice later  Discharge Planning: [place] for rehab with Palliative care service follow-up        Primary Diagnoses: Present on Admission: **None**   I have reviewed the medical record, interviewed the patient and family, and examined the patient. The following aspects are pertinent.  Past Medical History:  Diagnosis Date  . Anemia   . Anxiety   . Chronic atrial fibrillation (HCC)    Not anticoagulated due to prior GI bleed.  . Chronic diastolic CHF (congestive heart failure) ([place])   . CKD (chronic kidney disease), stage IV ([place])   . Depression   . Falls   . Gout due to renal impairment involving toe of left foot   . Hypertension   . Hypothyroidism   . Osteoarthritis   . Peptic ulcer disease   . Pulmonary hypertension ([place])   . QT prolongation   . Subdural hematoma (HCC)    Social History   Socioeconomic History  . Marital status: Single    Spouse name: Not on file  . Number of children: Not on file  . Years of education: Not on file  . Highest education level: Not on file  Occupational History  . Not on file  Social Needs  . Financial resource strain: Not on file  . Food insecurity:    Worry: Not on file    Inability: Not on file  . Transportation needs:    Medical: Not on file    Non-medical: Not on file  Tobacco Use  . Smoking status: Former Research scientist (life sciences)  . Smokeless tobacco: Current User    Types: Chew  Substance and Sexual Activity  . Alcohol use: No  . Drug use: No  . Sexual activity: Not Currently  Lifestyle  . Physical activity:    Days per week: Not on file    Minutes per session: Not on file  . Stress: Not on file  Relationships  . Social connections:    Talks on phone: Not on file    Gets together: Not on file    Attends religious service: Not on file    Active member of club or organization: Not on file    Attends meetings of clubs or organizations: Not on file    Relationship status: Not on file  Other Topics Concern  . Not on file  Social History Narrative  . Not on file   Family History  Problem Relation Age of Onset  .  Other Neg Hx        Patient has been confused   Scheduled Meds: . Chlorhexidine Gluconate Cloth  6 each Topical Daily  . mouth rinse  15 mL Mouth Rinse BID  . nicotine  7 mg Transdermal Daily  . sodium chloride flush  10-40 mL Intracatheter Q12H   Continuous Infusions: . sodium chloride 50 mL/hr at 11/25/17 0900  . sodium chloride    . isoproterenol (ISUPREL) infusion 1 mcg/min (11/25/17 0900)  . sodium phosphate  Dextrose 5% IVPB 10 mmol (11/25/17 0936)   PRN  Meds:.acetaminophen, fentaNYL (SUBLIMAZE) injection, Melatonin, sodium chloride flush Medications Prior to Admission:  Prior to Admission medications   Medication Sig Start Date End Date Taking? Authorizing Provider  allopurinol (ZYLOPRIM) 100 MG tablet Take 100 mg by mouth daily. 02/06/16  Yes [provider]  ALPRAZolam (XANAX) 0.25 MG tablet Take 0.25 mg by mouth at bedtime as needed for anxiety.  05/27/16  Yes [provider]  cloNIDine (CATAPRES) 0.1 MG tablet Take 0.1 mg by mouth See admin instructions. Take 1 tablet every 12 hours as needed for systolic >161 or diastolic >096   Yes [provider]  levothyroxine (SYNTHROID, LEVOTHROID) 25 MCG tablet Take 12.5 mcg by mouth daily before breakfast.    Yes [provider]  metoprolol (LOPRESSOR) 50 MG tablet Take 50 mg by mouth 2 (two) times daily.     Yes [provider]  pantoprazole (PROTONIX) 40 MG tablet Take 40 mg by mouth 2 (two) times daily. 02/12/16  Yes [provider]  sulfamethoxazole-trimethoprim (BACTRIM DS,SEPTRA DS) 800-160 MG tablet Take 1 tablet by mouth 2 (two) times daily.   Yes [provider]  Vitamin D, Ergocalciferol, (DRISDOL) 50000 units CAPS capsule Take 50,000 Units by mouth every Sunday.    Yes [provider]   Allergies  Allergen Reactions  . Penicillins Rash    Has patient had a PCN reaction causing immediate rash, facial/tongue/throat swelling, SOB or lightheadedness with  hypotension: Yes Has patient had a PCN reaction causing severe rash involving mucus membranes or skin necrosis: No Has patient had a PCN reaction that required hospitalization: No Has patient had a PCN reaction occurring within the last 10 years: Yes If all of the above answers are "NO", then may proceed with Cephalosporin use.    Review of Systems  Unable to perform ROS: Mental status change    Physical Exam  Constitutional:  Frail elderly female; in mittens, confused  HENT:  Head: Normocephalic and atraumatic.  Cardiovascular:  Bradycardic  Pulmonary/Chest:  Mild increased work of breathing at rest  Genitourinary:  Genitourinary Comments: Purwik  Skin: Skin is warm and dry.  Psychiatric:  Anxious, mild agitation otherwise unable to test  Nursing note and vitals reviewed.   Vital Signs: BP 124/62   Pulse (!) 42   Temp 98.3 F (36.8 C) (Oral)   Resp 14   Ht _0  (1.6 m)   Wt 49.9 kg   SpO2 94%   BMI 19.49 kg/m  Pain Scale: Faces POSS *See Group Information*: 1-Acceptable,Awake and alert Pain Score: Asleep   SpO2: SpO2: 94 % O2 Device:SpO2: 94 % O2 Flow Rate: .   IO: Intake/output summary:   Intake/Output Summary (Last 24 hours) at 11/25/2017 0959 Last data filed at 11/25/2017 0900 Gross per 24 hour  Intake 1323.7 ml  Output 850 ml  Net 473.7 ml    LBM:   Baseline Weight: Weight: 49.9 kg Most recent weight: Weight: 49.9 kg     Palliative Assessment/Data:   Flowsheet Rows     Most Recent Value  Intake Tab  Referral Department  Critical care  Unit at Time of Referral  Cardiac/Telemetry Unit  Date Notified  12/15/2017  Palliative Care Type  New Palliative care  Reason for referral  Clarify Goals of Care  Date of Admission  12/04/2017  Date first seen by Palliative Care  11/25/17  # of days Palliative referral response time  1 Day(s)  # of days IP prior to Palliative referral  1  Clinical  Assessment  Palliative Performance Scale Score  30%  Pain Max  last 24 hours  Not able to report  Pain Min Last 24 hours  Not able to report  Dyspnea Max Last 24 Hours  Not able to report  Dyspnea Min Last 24 hours  Not able to report  Nausea Max Last 24 Hours  Not able to report  Nausea Min Last 24 Hours  Not able to report  Anxiety Max Last 24 Hours  Not able to report  Anxiety Min Last 24 Hours  Not able to report  Other Max Last 24 Hours  Not able to report  Psychosocial & Spiritual Assessment  Palliative Care Outcomes      Time In: 1500 Time Out: 1615 Time Total: 75 min Greater than 50%  of this time was spent counseling and coordinating care related to the above assessment and plan.  Signed by: Dory Horn, NP   Please contact Palliative Medicine Team phone at [phone] for questions and concerns.  For individual provider: See Shea Evans

## 2017-11-25 NOTE — Progress Notes (Signed)
  Speech Language Pathology Treatment: Dysphagia  Patient Details Name: Taylor Coleman MRN: [id number] DOB: 1929/03/23 Today's Date: 11/25/2017 Time: 5188-4166 SLP Time Calculation (min) (ACUTE ONLY): 8 min  Assessment / Plan / Recommendation Clinical Impression  Pt tolerating diet, ate about 30% per NT, no coughing observed. Pt more alert and appropriate today, requested water, took sips with minimal cueing, no hand over hand assist needed. Given reported plan of care, would not expect pt to progress past puree diet acutely. Will continue current diet and sign off.   HPI HPI: Pt is an 84 yo female admitted to [facility] s/p fall and transferred to [facility] due to persistent bradycardia. CT Head negative. Found to have UTI. At [facility] she was noted to be coughing with thin liquids and pocketing food, therefore swallow evaluation was ordered. PMH: anxiety, afib, CHF, depression, gout, HTN, arthritis, PUD      SLP Plan  All goals met       Recommendations  Diet recommendations: Dysphagia 1 (puree);Thin liquid Liquids provided via: Straw Medication Administration: Crushed with puree Supervision: Full supervision/cueing for compensatory strategies Compensations: Slow rate;Small sips/bites Postural Changes and/or Swallow Maneuvers: Seated upright 90 degrees                Follow up Recommendations: None Plan: All goals met       GO                Lauri Till, Katherene Ponto 11/25/2017, 10:40 AM

## 2017-11-25 NOTE — Progress Notes (Addendum)
Name: Taylor Coleman MRN: [id number] DOB: [birth year]    ADMISSION DATE:  12/03/2017 CONSULTATION DATE:  12/05/2017  REFERRING MD :  [facility]   CHIEF COMPLAINT:  Fall, Bradycardia    BRIEF SUMMARY:  84 y/o F admitted 8/7 from [facility] after suffering a fall.  Work up notable for confusion and bradycardia.  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  Home medications were held (including metoprolol which had recently been increased).  Despite being off medications and dopamine infusion, she remained bradycardic.  On 8/7 pm, she had an episode of VT requiring defibrillation.    SUBJECTIVE:  RN reports pt remains on isuprel gtt.  Sitter at bedside.  No acute events overnight.   VITAL SIGNS: Temp:  [96.9 F (36.1 C)-98.9 F (37.2 C)] 98.3 F (36.8 C) (08/09 0700) Pulse Rate:  [41-105] 42 (08/09 0800) Resp:  [11-31] 14 (08/09 0800) BP: (106-160)/(46-123) 124/62 (08/09 0800) SpO2:  [92 %-100 %] 94 % (08/09 0800)  PHYSICAL EXAMINATION: General: frail elderly female in NAD lying in bed HEENT: MM pink/dry, no jvd Neuro: Awakens to voice, speech clear, MAE, pleasant confusion  CV: s1s2 rrr, 3/6 SEM PULM: even/non-labored, lungs bilaterally clear  FR:TMYT, non-tender, bsx4 active  Extremities: warm/dry, no edema  Skin: no rashes or lesions  Recent Labs  Lab 12/09/2017 1752 11/18/2017 0451 11/25/17 0459  NA 141 141 142  K 4.8 4.1 4.7  CL 104 106 109  CO2 22 18* 21*  BUN 46* 50* 50*  CREATININE 2.44* 2.49* 1.91*  GLUCOSE 153* 243* 133*    Recent Labs  Lab 12/06/2017 1752 12/08/2017 0451 11/25/17 0459  HGB 10.5* 10.0* 9.8*  HCT 32.9* 31.1* 30.8*  WBC 11.6* 10.0 8.6  PLT 70* 64* 65*    Portable Chest X-ray 1 View  Result Date: 11/22/2017 CLINICAL DATA:  Bradycardia. EXAM: PORTABLE CHEST 1 VIEW COMPARISON:  Chest radiograph November 22, 2017 FINDINGS: Stable cardiomegaly.  Calcified aortic arch. Mild chronic interstitial changes without pleural effusion or focal consolidation. Persistently elevated RIGHT hemidiaphragm. RIGHT internal jugular central venous catheter distal tip projects in mid superior vena cava. No pneumothorax. Osteopenia. Coarse calcifications LEFT breast. IMPRESSION: Stable cardiomegaly. No acute pulmonary process. Stable appearance of RIGHT IJ line. Aortic Atherosclerosis (ICD10-I70.0). Electronically Signed   By: Elon Alas M.D.   On: 12/02/2017 19:45    SIGNIFICANT EVENTS  8/07  Admit  8/08  Defibrillation overnight for VT.  No compressions.   STUDIES   CULTURES   ANTIBIOTICS     ASSESSMENT / PLAN:  Discussion:  84 y/o F admitted 8/7 after suffering a fall at home.  Recent increase in metoprolol as outpatient.  Work up concerning for sick sinus syndrome / bradycardia.  She continued to have bradycardia >48 hours off metoprolol and on dopamine infusion. Hospital course complicated by AKI on CKD.     Sick Sinus Syndrome with Syncopal Episode  VT - episode of pulsatile VT / altered LOC 8/7 pm s/p defibrillation P: Cardiology signed off > no pacemaker Continue Isuprel for now to allow family time to visit  DNR/DNI Pending Palliative Care consult  Tele monitoring  Hold home lopressor    Mechanical Fall - in setting of sick sinus, bradycardia  P: Supportive care PT efforts if patient able   Chronic Atrial Fibrillation P: Continue ASA Tele monitoring for now   AKI on CKD IV  Anion Gap -  resolved, suspect lactic + AKI Hypomagnesemia P: Replace Phos  Trend BMP / urinary output Replace electrolytes as indicated Avoid nephrotoxic agents, ensure adequate renal perfusion   Anemia  Thrombocytopenia  P: Trend CBC  Monitor for bleeding    Agitated Delirium  ? Underlying Dementia  P: Promote sleep / wake cycle  Supportive care    Dysphagia  P: D1 Diet Aspiration precautions  Goals of Care - once  family have visited / ok with moving forward, recommend stopping isuprel gtt.  Continue supportive care.  If declines, would proceed with comfort measures.    Noe Gens, NP-C [facility] Pgr: [phone] or if no answer [phone] 11/25/2017, 8:34 AM   Attestation The patient had symtpomatic bradycardia. She appears to have sinus rhtythm presently. Palliative Care met with the family. They do want a PPM. The plan is to be transferred to a nursing home shortly. The family has requested that the Isuprel be turned off at this time. The patient is DNR/DNI

## 2017-11-25 NOTE — Progress Notes (Signed)
I spoke briefly to the patient's granddaughter and grandson. They asked if I would stop the Isuporel which I will do. The patient is in a NSR in the 60s. She is fairly alert and apparently fed herself earlier. The extended family met with Palliative care earlier. We appreciate theoir efforts and recommendations. The family does not want a PPM and the patient is DNR/DNI.

## 2017-11-26 LAB — GLUCOSE, CAPILLARY
GLUCOSE-CAPILLARY: 161 mg/dL — AB (ref 70–99)
GLUCOSE-CAPILLARY: 173 mg/dL — AB (ref 70–99)
Glucose-Capillary: 132 mg/dL — ABNORMAL HIGH (ref 70–99)

## 2017-11-26 LAB — CBC
HEMATOCRIT: 31.3 % — AB (ref 36.0–46.0)
Hemoglobin: 9.9 g/dL — ABNORMAL LOW (ref 12.0–15.0)
MCH: 31.4 pg (ref 26.0–34.0)
MCHC: 31.6 g/dL (ref 30.0–36.0)
MCV: 99.4 fL (ref 78.0–100.0)
Platelets: 65 10*3/uL — ABNORMAL LOW (ref 150–400)
RBC: 3.15 MIL/uL — AB (ref 3.87–5.11)
RDW: 14.7 % (ref 11.5–15.5)
WBC: 5.3 10*3/uL (ref 4.0–10.5)

## 2017-11-26 LAB — BASIC METABOLIC PANEL
ANION GAP: 9 (ref 5–15)
BUN: 45 mg/dL — AB (ref 8–23)
CHLORIDE: 109 mmol/L (ref 98–111)
CO2: 20 mmol/L — AB (ref 22–32)
Calcium: 9 mg/dL (ref 8.9–10.3)
Creatinine, Ser: 1.8 mg/dL — ABNORMAL HIGH (ref 0.44–1.00)
GFR calc Af Amer: 28 mL/min — ABNORMAL LOW (ref >60)
GFR, EST NON AFRICAN AMERICAN: 24 mL/min — AB (ref >60)
GLUCOSE: 127 mg/dL — AB (ref 70–99)
Potassium: 4.3 mmol/L (ref 3.5–5.1)
Sodium: 138 mmol/L (ref 135–145)

## 2017-11-26 MED ORDER — LEVOTHYROXINE SODIUM 25 MCG PO TABS
12.5000 ug | ORAL_TABLET | Freq: Every day | ORAL | Status: DC
Start: 2017-11-27 — End: 2017-11-30
  Administered 2017-11-27 – 2017-11-29 (×3): 12.5 ug via ORAL
  Filled 2017-11-26 (×3): qty 1

## 2017-11-26 MED ORDER — ALPRAZOLAM 0.25 MG PO TABS
0.2500 mg | ORAL_TABLET | Freq: Every evening | ORAL | Status: DC | PRN
  Administered 2017-11-28: 0.25 mg via ORAL
  Filled 2017-11-26: qty 1

## 2017-11-26 MED ORDER — ALLOPURINOL 100 MG PO TABS
100.0000 mg | ORAL_TABLET | Freq: Every day | ORAL | Status: DC
  Administered 2017-11-26 – 2017-11-29 (×4): 100 mg via ORAL
  Filled 2017-11-26 (×4): qty 1

## 2017-11-26 MED ORDER — VITAMIN D (ERGOCALCIFEROL) 1.25 MG (50000 UNIT) PO CAPS
50000.0000 [IU] | ORAL_CAPSULE | ORAL | Status: DC
  Administered 2017-11-27: 50000 [IU] via ORAL
  Filled 2017-11-26: qty 1

## 2017-11-26 NOTE — Progress Notes (Signed)
Name: Taylor Coleman MRN: [id number] DOB: [birth year]    ADMISSION DATE:  12/13/2017 CONSULTATION DATE:  11/25/2017  REFERRING MD :  [facility]   CHIEF COMPLAINT:  Fall, Bradycardia    BRIEF SUMMARY:  84 y/o F admitted 8/7 from [facility] after suffering a fall.  Work up notable for confusion and bradycardia.  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  Home medications were held (including metoprolol which had recently been increased).  Despite being off medications and dopamine infusion, she remained bradycardic.  On 8/7 pm, she had an episode of VT requiring defibrillation.    SUBJECTIVE:  RN reports pt remains on isuprel gtt.  Sitter at bedside.  No acute events overnight.   VITAL SIGNS: Temp:  [98.2 F (36.8 C)-98.9 F (37.2 C)] 98.6 F (37 C) (08/10 0756) Pulse Rate:  [33-104] 33 (08/10 0600) Resp:  [13-22] 19 (08/10 0600) BP: (102-154)/(44-104) 129/57 (08/10 0600) SpO2:  [94 %-99 %] 94 % (08/09 1800)  PHYSICAL EXAMINATION: General: frail elderly female in NAD lying in bed HEENT: MM pink/dry, no jvd Neuro: Awakens to voice, speech clear, MAE, pleasant confusion  CV: s1s2 rrr, 3/6 SEM PULM: even/non-labored, lungs bilaterally clear  ZJ:QBHA, non-tender, bsx4 active  Extremities: warm/dry, no edema  Skin: no rashes or lesions  Recent Labs  Lab 12/13/2017 0451 11/25/17 0459 11/26/17 0506  NA 141 142 138  K 4.1 4.7 4.3  CL 106 109 109  CO2 18* 21* 20*  BUN 50* 50* 45*  CREATININE 2.49* 1.91* 1.80*  GLUCOSE 243* 133* 127*    Recent Labs  Lab 12/14/2017 0451 11/25/17 0459 11/26/17 0506  HGB 10.0* 9.8* 9.9*  HCT 31.1* 30.8* 31.3*  WBC 10.0 8.6 5.3  PLT 64* 65* 65*    No results found.  SIGNIFICANT EVENTS  8/07  Admit  8/08  Defibrillation overnight for VT.  No compressions.    ASSESSMENT / PLAN:  Discussion:  84 y/o F admitted 8/7 after suffering a  fall at home.  Recent increase in metoprolol as outpatient.  Work up concerning for sick sinus syndrome / bradycardia.  She continued to have bradycardia >48 hours off metoprolol and on dopamine infusion. Hospital course complicated by AKI on CKD.   8/10 The patient and family met with palliative care. The plan is to get the patient to [facility] home. The patient may not nmeet residential hospice criteria. Orders written for symptom management   Sick Sinus Syndrome with Syncopal Episode  VT - episode of pulsatile VT / altered LOC 8/7 pm s/p defibrillation P: Cardiology signed off > no pacemaker Continue Isuprel for now to allow family time to visit  DNR/DNI Pending Palliative Care consult  Tele monitoring  Hold home lopressor  8/10  no plan for PPM.the patient has a sinus brady and most likely a SSS> We are holding beta blockers. mainatianing BP at this point    Mechanical Fall - in setting of sick sinus, bradycardia  P: Supportive care PT efforts if patient able   Chronic Atrial Fibrillation P: Continue ASA Tele monitoring for now   AKI on CKD IV  Anion Gap - resolved, suspect lactic + AKI Hypomagnesemia P: Replace Phos  Trend BMP / urinary output Replace electrolytes as indicated Avoid nephrotoxic agents, ensure adequate renal perfusion 8/10 Her renal fn. has gotten progressively better the past few days. Present creat is about 1.8. Her urine  output is a bit scanty but the patient is not imbibing a large amount of  Fluid. AG acidosi resolved   Anemia  Thrombocytopenia  P: Trend CBC  Monitor for bleeding 8/10 No real change inplatelet count    Agitated Delirium  ? Underlying Dementia  P: Promote sleep / wake cycle  Supportive care    Dysphagia  P: D1 Diet Aspiration precautions  Goals of Care - once family have visited / ok with moving forward, recommend stopping isuprel gtt.  Continue supportive care.  If declines, would proceed with  comfort measures.   The patient is DNR/DNI  Micheal Likens MD [facility] Pgr: [phone] or if no answer [phone] 11/26/2017, 9:24 AM

## 2017-11-26 NOTE — Plan of Care (Signed)
  Problem: Health Behavior/Discharge Planning: Goal: Ability to manage health-related needs will improve Outcome: Progressing   Problem: Clinical Measurements: Goal: Ability to maintain clinical measurements within normal limits will improve Outcome: Progressing Goal: Will remain free from infection Outcome: Progressing Goal: Diagnostic test results will improve Outcome: Progressing Goal: Respiratory complications will improve Outcome: Progressing Goal: Cardiovascular complication will be avoided Outcome: Progressing   Problem: Activity: Goal: Risk for activity intolerance will decrease Outcome: Progressing   Problem: Nutrition: Goal: Adequate nutrition will be maintained Outcome: Progressing Note:  Encouraging patient to eat, family is very helpful   Problem: Coping: Goal: Level of anxiety will decrease Outcome: Progressing   Problem: Elimination: Goal: Will not experience complications related to bowel motility Outcome: Progressing   Problem: Pain Managment: Goal: General experience of comfort will improve Outcome: Progressing   Problem: Safety: Goal: Ability to remain free from injury will improve Outcome: Progressing Note:  She is less restless and confused currently, has sitter and family at bedside, will continue to monitor   Problem: Skin Integrity: Goal: Risk for impaired skin integrity will decrease Outcome: Progressing

## 2017-11-26 NOTE — NC FL2 (Signed)
[place] MEDICAID FL2 LEVEL OF CARE SCREENING TOOL     IDENTIFICATION  Patient Name: Taylor Coleman Birthdate: [birth year] Sex: female Admission Date (Current Location): 12/14/2017  [facility] and [place] Number:  Producer, television/film/video and Address:  [facility], [address] [place], [place] [address]      Provider Number: [id number]  Attending Physician Name and Address:  Lynnell Catalan, MD  Relative Name and Phone Number:       Current Level of Care: Hospital Recommended Level of Care: Skilled Nursing Facility Prior Approval Number:    Date Approved/Denied: 11/22/17 PASRR Number:  [id number]   Discharge Plan: SNF    Current Diagnoses: Patient Active Problem List   Diagnosis Date Noted  . Goals of care, counseling/discussion   . Palliative care by specialist   . Bradycardia 12/01/2017  . Chronic atrial fibrillation (HCC)   . Acute kidney injury superimposed on CKD (HCC)   . Urinary tract infection, E. coli 04/07/2011  . Subdural hematoma, post-traumatic (HCC) 04/04/2011    Orientation RESPIRATION BLADDER Height & Weight     Self, Time  Normal External catheter, Incontinent(Placed 11/23/17) Weight: 110 lb 0.2 oz (49.9 kg) Height:  5\' 3"  (160 cm)  BEHAVIORAL SYMPTOMS/MOOD NEUROLOGICAL BOWEL NUTRITION STATUS      Incontinent Diet(DYS 1 diet, thin liquids)  AMBULATORY STATUS COMMUNICATION OF NEEDS Skin   Extensive Assist Verbally Normal                       Personal Care Assistance Level of Assistance  Bathing, Feeding, Dressing Bathing Assistance: Maximum assistance Feeding assistance: Limited assistance Dressing Assistance: Maximum assistance     Functional Limitations Info  Sight, Hearing, Speech Sight Info: Adequate Hearing Info: Adequate Speech Info: Adequate    SPECIAL CARE FACTORS FREQUENCY  PT (By licensed PT), OT (By licensed OT)     PT Frequency: 2x OT Frequency: 2x            Contractures Contractures  Info: Not present    Additional Factors Info  Code Status, Allergies Code Status Info: DNR Allergies Info: Penicillins           Current Medications (11/26/2017):  This is the current hospital active medication list Current Facility-Administered Medications  Medication Dose Route Frequency Provider Last Rate Last Dose  . 0.9 %  sodium chloride infusion   Intravenous Continuous Lynnell Catalan, MD 50 mL/hr at 11/26/17 0900    . 0.9 %  sodium chloride infusion   Intravenous Continuous Agarwala, Ravi, MD      . acetaminophen (TYLENOL) tablet 650 mg  650 mg Oral Q4H PRN Agarwala, Daleen Bo, MD      . allopurinol (ZYLOPRIM) tablet 100 mg  100 mg Oral Daily Otho Najjar, MD   100 mg at 11/26/17 1024  . ALPRAZolam Prudy Feeler) tablet 0.25 mg  0.25 mg Oral QHS PRN Otho Najjar, MD      . Chlorhexidine Gluconate Cloth 2 % PADS 6 each  6 each Topical Daily Lynnell Catalan, MD   6 each at 11/26/17 1055  . fentaNYL (SUBLIMAZE) injection 12.5 mcg  12.5 mcg Intravenous Q1H PRN Regan Lemming, MD   12.5 mcg at 12/01/2017 2302  . [START ON 11/27/2017] levothyroxine (SYNTHROID, LEVOTHROID) tablet 12.5 mcg  12.5 mcg Oral QAC breakfast Otho Najjar, MD      . MEDLINE mouth rinse  15 mL Mouth Rinse BID Lynnell Catalan, MD   15 mL  at 11/26/17 1055  . Melatonin TABS 3 mg  3 mg Oral QHS PRN Lynnell CatalanAgarwala, Ravi, MD      . nicotine (NICODERM CQ - dosed in mg/24 hr) patch 7 mg  7 mg Transdermal Daily Agarwala, Daleen Boavi, MD   7 mg at 11/26/17 1024  . sodium chloride flush (NS) 0.9 % injection 10-40 mL  10-40 mL Intracatheter Q12H Lynnell CatalanAgarwala, Ravi, MD   10 mL at 11/25/17 2238  . sodium chloride flush (NS) 0.9 % injection 10-40 mL  10-40 mL Intracatheter PRN Lynnell CatalanAgarwala, Ravi, MD      . Melene Muller[START ON 11/27/2017] Vitamin D (Ergocalciferol) (DRISDOL) capsule 50,000 Units  50,000 Units Oral Q Leveda AnnaSun Rosenblatt, Marveen Reeksobert J, MD         Discharge Medications: Please see discharge summary for a list of discharge  medications.  Relevant Imaging Results:  Relevant Lab Results:   Additional Information SSN: [id number]  Maree KrabbeBridget A Ahnesti Townsend, LCSW

## 2017-11-26 NOTE — Clinical Social Work Note (Signed)
Clinical Social Work Assessment  Patient Details  Name: Taylor Coleman MRN: [id number] Date of Birth: [birth year]  Date of referral:  11/26/17               Reason for consult:  Facility Placement                Permission sought to share information with:  Facility Medical sales representativeContact Representative, Family Supports Permission granted to share information::     Name::     Advertising account executiveAmanda  Agency::  [facility] [place]  Relationship::  Lawyergrandaughter  Contact Information:     Housing/Transportation Living arrangements for the past 2 months:  Single Family Home Source of Information:  Other (Comment Required)(Grandaughter) Patient Interpreter Needed:  None Criminal Activity/Legal Involvement Pertinent to Current Situation/Hospitalization:  No - Comment as needed Significant Relationships:  Adult Children, Other Family Members Lives with:  Self Do you feel safe going back to the place where you live?  No Need for family participation in patient care:  No (Coment)  Care giving concerns:  Pt is only alert to self and time. No additional caregivers noted in chart.    Social Worker assessment / plan:  CSW spoke with pt's Granddaughter via telephone. [place] lives alone in [place]. Granddaughter confirmed that pt will go to SNF at d/c and the preference is [facility] in [place] or [facility] in [place]. CSW will send referall and follow up once bed availability is determined. Pt's Granddaughter is hopeful taht pt can return to living independently after SNF>  Employment status:  Retired Database administratornsurance information:  Managed Medicare PT Recommendations:  Skilled Nursing Facility Information / Referral to community resources:  Skilled Nursing Facility  Patient/Family's Response to care:  Pt's Granddaughter verbalized understanding of CSW role and expressed appreciation for support. Pt's Granddaughter denies any concern regarding pt's care at this time.   Patient/Family's Understanding of and Emotional  Response to Diagnosis, Current Treatment, and Prognosis:  Pt's Granddaughter understanding and realistic regarding pt's physical limitations. Pt's Granddaughter understands the need for pt to go to SNF at d/c-- Pt's Granddaughter agreeable, on behalf of pt, at this time. Pt's Granddaughter denies any concern regarding pt's treatment plan at this time. CSW will continue to provide support and facilitate d/c needs.   Emotional Assessment Appearance:  Appears stated age Attitude/Demeanor/Rapport:  Unable to Assess Affect (typically observed):  Unable to Assess Orientation:  Oriented to Self, Oriented to  Time Alcohol / Substance use:  Not Applicable Psych involvement (Current and /or in the community):  No (Comment)  Discharge Needs  Concerns to be addressed:  Basic Needs, Care Coordination Readmission within the last 30 days:  No Current discharge risk:  Dependent with Mobility Barriers to Discharge:  Continued Medical Work up   Pacific MutualBridget A Bedie Dominey, LCSW 11/26/2017, 4:31 PM

## 2017-11-27 ENCOUNTER — Other Ambulatory Visit: Payer: Self-pay

## 2017-11-27 DIAGNOSIS — R55 Syncope and collapse: Secondary | ICD-10-CM

## 2017-11-27 LAB — GLUCOSE, CAPILLARY
GLUCOSE-CAPILLARY: 152 mg/dL — AB (ref 70–99)
GLUCOSE-CAPILLARY: 171 mg/dL — AB (ref 70–99)
Glucose-Capillary: 139 mg/dL — ABNORMAL HIGH (ref 70–99)

## 2017-11-27 NOTE — Progress Notes (Signed)
Name: Taylor Coleman MRN: [id number] DOB: 1929/01/16    ADMISSION DATE:  11/20/2017 CONSULTATION DATE:  12/06/2017  REFERRING MD :  [facility]   CHIEF COMPLAINT:  Fall, Bradycardia    BRIEF SUMMARY:  84 y/o F admitted 8/7 from [facility] after suffering a fall.  Work up notable for confusion and bradycardia.  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  Home medications were held (including metoprolol which had recently been increased).  Despite being off medications and dopamine infusion, she remained bradycardic.  On 8/7 pm, she had an episode of VT requiring defibrillation.   8/11 The patient appears vcomfortable. No major complaints. Heart rate in the 30s-50s but patient maintaining her BP . Appetite is good The aptient is alert and cooperative. WE will likely arrange transfer to [facility] and movement to stepdown unit  SUBJECTIVE:  RN reports pt remains on isuprel gtt.  Sitter at bedside.  No acute events overnight.   VITAL SIGNS: Temp:  [96.9 F (36.1 C)-98.6 F (37 C)] 96.9 F (36.1 C) (08/11 0800) Pulse Rate:  [28-78] 35 (08/11 0800) Resp:  [13-22] 18 (08/11 0800) BP: (113-174)/(56-96) 150/75 (08/11 0800) SpO2:  [92 %-98 %] 93 % (08/11 0800)  PHYSICAL EXAMINATION: General: frail elderly female in NAD lying in bed HEENT: MM pink/dry, no jvd Neuro: Awakens to voice, speech clear, MAE, pleasant confusion  CV: s1s2 rrr, 3/6 SEM PULM: even/non-labored, lungs bilaterally clear  IP:JASN, non-tender, bsx4 active  Extremities: warm/dry, no edema  Skin: no rashes or lesions  Recent Labs  Lab 12/04/2017 0451 11/25/17 0459 11/26/17 0506  NA 141 142 138  K 4.1 4.7 4.3  CL 106 109 109  CO2 18* 21* 20*  BUN 50* 50* 45*  CREATININE 2.49* 1.91* 1.80*  GLUCOSE 243* 133* 127*    Recent Labs  Lab 11/18/2017 0451 11/25/17 0459 11/26/17 0506  HGB 10.0* 9.8* 9.9*  HCT 31.1*  30.8* 31.3*  WBC 10.0 8.6 5.3  PLT 64* 65* 65*    No results found.  SIGNIFICANT EVENTS  8/07  Admit  8/08  Defibrillation overnight for VT.  No compressions.    ASSESSMENT / PLAN:  Discussion:  84 y/o F admitted 8/7 after suffering a fall at home.  Recent increase in metoprolol as outpatient.  Work up concerning for sick sinus syndrome / bradycardia.  She continued to have bradycardia >48 hours off metoprolol and on dopamine infusion. Hospital course complicated by AKI on CKD.   8/10 The patient and family met with palliative care. The plan is to get the patient to [facility] nursing home. The patient may not nmeet residential hospice criteria. Orders written for symptom management   Sick Sinus Syndrome with Syncopal Episode  VT - episode of pulsatile VT / altered LOC 8/7 pm s/p defibrillation P: Cardiology signed off > no pacemaker Continue Isuprel for now to allow family time to visit  DNR/DNI Pending Palliative Care consult  Tele monitoring  Hold home lopressor  8/10  no plan for PPM.the patient has a sinus brady and most likely a SSS> We are holding beta blockers. mainatianing BP at this point. 8/11 Bradycardic but maintains bp. Off Isuprel for 2 days now    Mechanical Fall - in setting of sick sinus, bradycardia  P: Supportive care PT efforts if patient able   Chronic Atrial Fibrillation P: Continue ASA Tele monitoring for now   AKI on  CKD IV  Anion Gap - resolved, suspect lactic + AKI Hypomagnesemia P: Replace Phos  Trend BMP / urinary output Replace electrolytes as indicated Avoid nephrotoxic agents, ensure adequate renal perfusion 8/10 Her renal fn. has gotten progressively better the past few days. Present creat is about 1.8. Her urine output is a bit scanty but the patient is not imbibing a large amount of  Fluid. AG acidosis resolved 8/11  renal panel requested AM Urine output yesterday just over 1 liter   Anemia  Thrombocytopenia   P: Trend CBC  Monitor for bleeding 8/10 No real change inplatelet count    Agitated Delirium  ? Underlying Dementia  P: Promote sleep / wake cycle  Supportive care    Dysphagia  P: D1 Diet Aspiration precautions  Goals of Care - once family have visited / ok with moving forward, recommend stopping isuprel gtt.  Continue supportive care.  If declines, would proceed with comfort measures.   The patient is DNR/DNI. As noted will move patient to stepdownand transfer to care of [facility].  Micheal Likens MD [facility] Pgr: [phone] or if no answer [phone] 11/27/2017, 9:13 AM  Cell [phone]

## 2017-11-28 LAB — BASIC METABOLIC PANEL
Anion gap: 8 (ref 5–15)
BUN: 27 mg/dL — ABNORMAL HIGH (ref 8–23)
CO2: 20 mmol/L — ABNORMAL LOW (ref 22–32)
Calcium: 9 mg/dL (ref 8.9–10.3)
Chloride: 107 mmol/L (ref 98–111)
Creatinine, Ser: 1.24 mg/dL — ABNORMAL HIGH (ref 0.44–1.00)
GFR calc Af Amer: 44 mL/min — ABNORMAL LOW (ref >60)
GFR calc non Af Amer: 38 mL/min — ABNORMAL LOW (ref >60)
Glucose, Bld: 131 mg/dL — ABNORMAL HIGH (ref 70–99)
Potassium: 4.2 mmol/L (ref 3.5–5.1)
Sodium: 135 mmol/L (ref 135–145)

## 2017-11-28 LAB — CBC WITH DIFFERENTIAL/PLATELET
ABS IMMATURE GRANULOCYTES: 0 10*3/uL (ref 0.0–0.1)
Basophils Absolute: 0 10*3/uL (ref 0.0–0.1)
Basophils Relative: 0 %
Eosinophils Absolute: 0.4 10*3/uL (ref 0.0–0.7)
Eosinophils Relative: 5 %
HEMATOCRIT: 36.4 % (ref 36.0–46.0)
HEMOGLOBIN: 11.9 g/dL — AB (ref 12.0–15.0)
IMMATURE GRANULOCYTES: 0 %
LYMPHS PCT: 16 %
Lymphs Abs: 1.1 10*3/uL (ref 0.7–4.0)
MCH: 31.4 pg (ref 26.0–34.0)
MCHC: 32.7 g/dL (ref 30.0–36.0)
MCV: 96 fL (ref 78.0–100.0)
MONOS PCT: 8 %
Monocytes Absolute: 0.5 10*3/uL (ref 0.1–1.0)
NEUTROS PCT: 71 %
Neutro Abs: 4.9 10*3/uL (ref 1.7–7.7)
Platelets: 79 10*3/uL — ABNORMAL LOW (ref 150–400)
RBC: 3.79 MIL/uL — AB (ref 3.87–5.11)
RDW: 14.6 % (ref 11.5–15.5)
WBC: 6.9 10*3/uL (ref 4.0–10.5)

## 2017-11-28 LAB — GLUCOSE, CAPILLARY
GLUCOSE-CAPILLARY: 106 mg/dL — AB (ref 70–99)
GLUCOSE-CAPILLARY: 165 mg/dL — AB (ref 70–99)
Glucose-Capillary: 199 mg/dL — ABNORMAL HIGH (ref 70–99)

## 2017-11-28 MED ORDER — SENNOSIDES-DOCUSATE SODIUM 8.6-50 MG PO TABS
1.0000 | ORAL_TABLET | Freq: Two times a day (BID) | ORAL | Status: DC
  Administered 2017-11-28 – 2017-11-29 (×3): 1 via ORAL
  Filled 2017-11-28 (×3): qty 1

## 2017-11-28 NOTE — Care Management Important Message (Signed)
Important Message  Patient Details  Name: Taylor Coleman MRN: [id number] Date of Birth: 1928/12/12   Medicare Important Message Given:  Yes    Sanah Kraska P Crissy Mccreadie 11/28/2017, 4:13 PM

## 2017-11-28 NOTE — Progress Notes (Signed)
Physical Therapy Treatment Patient Details Name: Taylor FlemingSara L Hainer MRN: [id number] DOB: Nov 07, 1928 Today's Date: 11/28/2017    History of Present Illness 84 yo admitted with fall and symptomatic bradycardia to 30. PMhx: chronic T2 compression fx, microvascular strokes, anxiety, CHF, gout, HTN    PT Comments    Pt much more awake and aware than during last PT session. Improved mobility. Continue to recommend ST-SNF for progression of mobility with eventual goal of pt returning to her home.    Follow Up Recommendations  SNF;Supervision/Assistance - 24 hour     Equipment Recommendations  None recommended by PT    Recommendations for Other Services       Precautions / Restrictions Precautions Precautions: Fall Restrictions Weight Bearing Restrictions: No    Mobility  Bed Mobility Overal bed mobility: Needs Assistance Bed Mobility: Supine to Sit     Supine to sit: Min assist     General bed mobility comments: Assist to elevate trunk into sitting  Transfers Overall transfer level: Needs assistance Equipment used: 4-wheeled walker Transfers: Sit to/from Stand Sit to Stand: Min assist         General transfer comment: Assist to bring hips up and for balance  Ambulation/Gait Ambulation/Gait assistance: Min assist Gait Distance (Feet): 160 Feet Assistive device: 4-wheeled walker Gait Pattern/deviations: Step-through pattern;Decreased stride length;Trunk flexed Gait velocity: decr Gait velocity interpretation: <1.8 ft/sec, indicate of risk for recurrent falls General Gait Details: Assist for balance and support   Stairs             Wheelchair Mobility    Modified Rankin (Stroke Patients Only)       Balance Overall balance assessment: Needs assistance Sitting-balance support: No upper extremity supported;Feet supported Sitting balance-Leahy Scale: Fair     Standing balance support: Single extremity supported Standing balance-Leahy Scale:  Poor Standing balance comment: UE support                            Cognition Arousal/Alertness: Awake/alert Behavior During Therapy: WFL for tasks assessed/performed Overall Cognitive Status: Impaired/Different from baseline Area of Impairment: Memory;Safety/judgement                     Memory: Decreased short-term memory Following Commands: Follows multi-step commands consistently Safety/Judgement: Decreased awareness of deficits;Decreased awareness of safety            Exercises      General Comments        Pertinent Vitals/Pain Pain Assessment: No/denies pain    Home Living                      Prior Function            PT Goals (current goals can now be found in the care plan section) Acute Rehab PT Goals PT Goal Formulation: With patient Time For Goal Achievement: 12/05/17 Potential to Achieve Goals: Good Progress towards PT goals: Progressing toward goals    Frequency    Min 2X/week      PT Plan Current plan remains appropriate    Co-evaluation              AM-PAC PT "6 Clicks" Daily Activity  Outcome Measure  Difficulty turning over in bed (including adjusting bedclothes, sheets and blankets)?: A Little Difficulty moving from lying on back to sitting on the side of the bed? : Unable Difficulty sitting down on and standing up from a chair  with arms (e.g., wheelchair, bedside commode, etc,.)?: Unable Help needed moving to and from a bed to chair (including a wheelchair)?: A Little Help needed walking in hospital room?: A Little Help needed climbing 3-5 steps with a railing? : A Little 6 Click Score: 14    End of Session Equipment Utilized During Treatment: Gait belt Activity Tolerance: Patient tolerated treatment well Patient left: with call bell/phone within reach;in chair;with chair alarm set;with family/visitor present Nurse Communication: Mobility status PT Visit Diagnosis: Other abnormalities of gait  and mobility (R26.89);Muscle weakness (generalized) (M62.81);Unsteadiness on feet (R26.81)     Time: [id number] PT Time Calculation (min) (ACUTE ONLY): 31 min  Charges:  $Gait Training: 23-37 mins                     [facility] Heritage[facility]ary Batsheva Stevick PT [phone]    Angelina OkCary W [facility] 11/28/2017, 2:52 PM

## 2017-11-28 NOTE — Progress Notes (Addendum)
1:14 pm [facility] [place] has offered a bed for [facility] and [facility]. CSW to follow.  11:34 am CSW following for SNF placement. [facility] - updated PT notes needed, as patient not seen by PT since 12/12/2017. Also requested OT eval.   Discussed with granddaughter, Marchelle Folksmanda, on the phone. [facility] cannot offer a bed. Awaiting offer from [facility] [place] and others in [place] area. Patient does not yet have bed offers.  CSW to follow and support with discharge planning.  Abigail ButtsSusan Rontavious Albright, LCSWA [phone]

## 2017-11-28 NOTE — Progress Notes (Signed)
[place] TEAM 1 - Stepdown/ICU TEAM  Odessa FlemingSara L Lilley  ZOX:[id number] DOB: 09/15/1928 DOA: 11/21/2017 PCP: Patient, No Pcp Per    Brief Narrative:  84 y/o F admitted 8/7 from [facility] after suffering a fall, with an initial work up notable for confusion and bradycardia.  Her BB dose had recently been increased as an outpt. 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  Home medications were held (including metoprolol which had recently been increased).  Despite being off medications and dopamine infusion, she remained bradycardic. On 8/7 pm she had an episode of VT requiring defibrillation.   Subjective: Having her first bowel movement in 7 days p[er family at the time of my visit.  Denies cp, sob, n/v, or abdom pain.  Is anxious to go to rehab.    Assessment & Plan:  Sick Sinus Syndrome with Syncopal Episode  no PPM per family - avoid BB - cont PT/OT - HR stable at this time   Unstable VT episode of pulsatile VT but w/ altered LOC 8/7 pm s/p defibrillation  Chronic Atrial fib  Bradycardia is the issue at this time   AKI on CKD IV  crt rapidly improving - recent baseline not clear   Recent Labs  Lab 25-Oct-2017 1752 12/17/2017 0451 11/25/17 0459 11/26/17 0506 11/28/17 0333  CREATININE 2.44* 2.49* 1.91* 1.80* 1.24*    Agitated Delirium  ?underlying dementia - not agitated at the time of eval today   Dysphagia  D1 diet w/ aspiration precautions  DVT prophylaxis: SCDs Code Status: DNR - NO CODE Family Communication: spoke w/ granddaughter at bedside   Disposition Plan: for SNF placement   Consultants:  Palliative Care EP / Cardiology   Antimicrobials:  none   Objective: Blood pressure (!) 171/60, pulse 61, temperature 98.6 F (37 C), temperature source Axillary, resp. rate 20, height 5\' 3"  (1.6 m), weight 53.8 kg, SpO2 96 %.  Intake/Output Summary (Last 24 hours) at 11/28/2017  1733 Last data filed at 11/28/2017 1642 Gross per 24 hour  Intake 490 ml  Output 300 ml  Net 190 ml   Filed Weights   25-Oct-2017 1556 11/28/17 0319  Weight: 49.9 kg 53.8 kg    Examination: General: No acute respiratory distress Lungs: Clear to auscultation bilaterally without wheezes or crackles Cardiovascular: bradycardic - no appreciable M or rub  Abdomen: Nontender, nondistended, soft, bowel sounds positive, no rebound, no ascites, no appreciable mass Extremities: No significant cyanosis, clubbing, or edema bilateral lower extremities  CBC: Recent Labs  Lab 25-Oct-2017 1752  11/25/17 0459 11/26/17 0506 11/28/17 0333  WBC 11.6*   < > 8.6 5.3 6.9  NEUTROABS 10.1*  --   --   --  4.9  HGB 10.5*   < > 9.8* 9.9* 11.9*  HCT 32.9*   < > 30.8* 31.3* 36.4  MCV 97.6   < > 99.4 99.4 96.0  PLT 70*   < > 65* 65* 79*   < > = values in this interval not displayed.   Basic Metabolic Panel: Recent Labs  Lab 25-Oct-2017 1752  11/25/17 0459 11/26/17 0506 11/28/17 0333  NA 141   < > 142 138 135  K 4.8   < > 4.7 4.3 4.2  CL 104   < > 109 109 107  CO2 22   < > 21* 20* 20*  GLUCOSE 153*   < > 133* 127* 131*  BUN 46*   < >  50* 45* 27*  CREATININE 2.44*   < > 1.91* 1.80* 1.24*  CALCIUM 9.8   < > 9.6 9.0 9.0  MG 1.8  --  2.4  --   --   PHOS  --   --  2.3*  --   --    < > = values in this interval not displayed.   GFR: Estimated Creatinine Clearance: 26.9 mL/min (A) (by C-G formula based on SCr of 1.24 mg/dL (H)).  Liver Function Tests: Recent Labs  Lab 12/05/2017 1752  AST 84*  ALT 29  ALKPHOS 59  BILITOT 1.9*  PROT 6.6  ALBUMIN 3.6    Coagulation Profile: Recent Labs  Lab 12/05/2017 1752  INR 1.48    CBG: Recent Labs  Lab 11/27/17 0746 11/27/17 1153 11/27/17 1606 11/28/17 1224 11/28/17 1641  GLUCAP 139* 152* 171* 106* 165*    Recent Results (from the past 240 hour(s))  MRSA PCR Screening     Status: None   Collection Time: 12/02/2017  3:52 PM  Result Value Ref Range  Status   MRSA by PCR NEGATIVE NEGATIVE Final    Comment:        The GeneXpert MRSA Assay (FDA approved for NASAL specimens only), is one component of a comprehensive MRSA colonization surveillance program. It is not intended to diagnose MRSA infection nor to guide or monitor treatment for MRSA infections. Performed at [facility], [address] [place], [place] [address]      Scheduled Meds: . allopurinol  100 mg Oral Daily  . Chlorhexidine Gluconate Cloth  6 each Topical Daily  . levothyroxine  12.5 mcg Oral QAC breakfast  . mouth rinse  15 mL Mouth Rinse BID  . nicotine  7 mg Transdermal Daily  . senna-docusate  1 tablet Oral BID  . sodium chloride flush  10-40 mL Intracatheter Q12H  . Vitamin D (Ergocalciferol)  50,000 Units Oral Q Sun    LOS: 5 days   Lonia BloodJeffrey T. Kylar Leonhardt, MD [facility] Office  [phone] Pager - Text Page per Amion  If 7PM-7AM, please contact night-coverage per Amion 11/28/2017, 5:33 PM

## 2017-11-29 DIAGNOSIS — Z7189 Other specified counseling: Secondary | ICD-10-CM

## 2017-11-29 LAB — CBC
HCT: 35.7 % — ABNORMAL LOW (ref 36.0–46.0)
HEMOGLOBIN: 11.6 g/dL — AB (ref 12.0–15.0)
MCH: 31.6 pg (ref 26.0–34.0)
MCHC: 32.5 g/dL (ref 30.0–36.0)
MCV: 97.3 fL (ref 78.0–100.0)
Platelets: 91 10*3/uL — ABNORMAL LOW (ref 150–400)
RBC: 3.67 MIL/uL — ABNORMAL LOW (ref 3.87–5.11)
RDW: 15.1 % (ref 11.5–15.5)
WBC: 7.3 10*3/uL (ref 4.0–10.5)

## 2017-11-29 LAB — BASIC METABOLIC PANEL
ANION GAP: 9 (ref 5–15)
BUN: 18 mg/dL (ref 8–23)
CALCIUM: 9.1 mg/dL (ref 8.9–10.3)
CO2: 22 mmol/L (ref 22–32)
Chloride: 105 mmol/L (ref 98–111)
Creatinine, Ser: 1.12 mg/dL — ABNORMAL HIGH (ref 0.44–1.00)
GFR calc Af Amer: 50 mL/min — ABNORMAL LOW (ref >60)
GFR calc non Af Amer: 43 mL/min — ABNORMAL LOW (ref >60)
GLUCOSE: 135 mg/dL — AB (ref 70–99)
Potassium: 4.1 mmol/L (ref 3.5–5.1)
Sodium: 136 mmol/L (ref 135–145)

## 2017-11-29 MED ORDER — OLOPATADINE HCL 0.1 % OP SOLN
1.0000 [drp] | Freq: Two times a day (BID) | OPHTHALMIC | Status: DC
  Administered 2017-11-29 (×2): 1 [drp] via OPHTHALMIC
  Filled 2017-11-29: qty 5

## 2017-11-29 NOTE — Progress Notes (Signed)
Taylor FlemingSara L Coleman  XLK:[id number] DOB: 09/01/1928 DOA: 12/08/2017 PCP: Patient, No Pcp Per    Brief Narrative:  [age] y/o F admitted [facility]7 [facility] after suffering a fall, with an initial work up notable for confusion and bradycardia.  Her BB dose had recently been increased as an outpt. 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  Home medications were held (including metoprolol which had recently been increased).  Despite being off medications and dopamine infusion, she remained bradycardic. On 8/7 pm she had an episode of VT requiring defibrillation.   Subjective: Pt was sleeping . Family members at bedside.  Assessment & Plan:  ##Sick Sinus Syndrome with Syncopal Episode  no PPM per family - avoid BB - cont PT/OT - HR stable at this time   ##Unstable VT episode of pulsatile VT but w/ altered LOC 8/7 pm s/p defibrillation  ##Chronic Atrial fib  Bradycardia is the issue at this time   ##AKI on CKD IV  crt rapidly improving - recent baseline not clear   ## Debility - PT rec SNIF - placement pending  ## Constipation - resolved with laxatives and stool softners  ## Encephalopathy metabolic - also concern about underlying dementia  ##Dysphagea - ST rec dysphagea diet  ## thrombocytopenia - stable  Recent Labs  Lab 2018-03-29 0451 11/25/17 0459 11/26/17 0506 11/28/17 0333 11/29/17 0259  CREATININE 2.49* 1.91* 1.80* 1.24* 1.12*     DVT prophylaxis: SCDs Code Status: DNR - NO CODE Family Communication: spoke w/ granddaughter at bedside   Disposition Plan: for SNF placement   Consultants:  Palliative Care EP / Cardiology   Antimicrobials:  none   Objective: Blood pressure (!) 152/72, pulse (!) 56, temperature 97.7 F (36.5 C), temperature source Oral, resp. rate 16, height 5\' 3"  (1.6 m), weight 54.9 kg, SpO2 100 %.  Intake/Output Summary (Last 24 hours) at 11/29/2017  1730 Last data filed at 11/29/2017 1300 Gross per 24 hour  Intake 360 ml  Output 150 ml  Net 210 ml   Filed Weights   12/15/2017 1556 11/28/17 0319 11/29/17 0144  Weight: 49.9 kg 53.8 kg 54.9 kg    Examination: General: No acute respiratory distress, sleeping. Lungs: Clear to auscultation bilaterally without wheezes or crackles Cardiovascular: bradycardic - no appreciable M or rub  Abdomen: Nontender, nondistended, soft, bowel sounds positive, no rebound, no ascites, no appreciable mass Extremities: No significant cyanosis, clubbing, or edema bilateral lower extremities  CBC: Recent Labs  Lab 11/17/2017 1752  11/26/17 0506 11/28/17 0333 11/29/17 0259  WBC 11.6*   < > 5.3 6.9 7.3  NEUTROABS 10.1*  --   --  4.9  --   HGB 10.5*   < > 9.9* 11.9* 11.6*  HCT 32.9*   < > 31.3* 36.4 35.7*  MCV 97.6   < > 99.4 96.0 97.3  PLT 70*   < > 65* 79* 91*   < > = values in this interval not displayed.   Basic Metabolic Panel: Recent Labs  Lab 12/17/2017 1752  11/25/17 0459 11/26/17 0506 11/28/17 0333 11/29/17 0259  NA 141   < > 142 138 135 136  K 4.8   < > 4.7 4.3 4.2 4.1  CL 104   < > 109 109 107 105  CO2 22   < > 21* 20* 20* 22  GLUCOSE 153*   < > 133* 127* 131* 135*  BUN 46*   < >  50* 45* 27* 18  CREATININE 2.44*   < > 1.91* 1.80* 1.24* 1.12*  CALCIUM 9.8   < > 9.6 9.0 9.0 9.1  MG 1.8  --  2.4  --   --   --   PHOS  --   --  2.3*  --   --   --    < > = values in this interval not displayed.   GFR: Estimated Creatinine Clearance: 29.8 mL/min (A) (by C-G formula based on SCr of 1.12 mg/dL (H)).  Liver Function Tests: Recent Labs  Lab 12/11/2017 1752  AST 84*  ALT 29  ALKPHOS 59  BILITOT 1.9*  PROT 6.6  ALBUMIN 3.6    Coagulation Profile: Recent Labs  Lab 11/29/2017 1752  INR 1.48    CBG: Recent Labs  Lab 11/27/17 0746 11/27/17 1153 11/27/17 1606 11/28/17 1224 11/28/17 1641  GLUCAP 139* 152* 171* 106* 165*    Recent Results (from the past 240 hour(s))  MRSA  PCR Screening     Status: None   Collection Time: 12/10/2017  3:52 PM  Result Value Ref Range Status   MRSA by PCR NEGATIVE NEGATIVE Final    Comment:        The GeneXpert MRSA Assay (FDA approved for NASAL specimens only), is one component of a comprehensive MRSA colonization surveillance program. It is not intended to diagnose MRSA infection nor to guide or monitor treatment for MRSA infections. Performed at [facility], [address] [place], [place] [address]      Scheduled Meds: . allopurinol  100 mg Oral Daily  . Chlorhexidine Gluconate Cloth  6 each Topical Daily  . levothyroxine  12.5 mcg Oral QAC breakfast  . mouth rinse  15 mL Mouth Rinse BID  . nicotine  7 mg Transdermal Daily  . olopatadine  1 drop Both Eyes BID  . senna-docusate  1 tablet Oral BID  . sodium chloride flush  10-40 mL Intracatheter Q12H  . Vitamin D (Ergocalciferol)  50,000 Units Oral Q Sun    LOS: 6 days   Susa GriffinsPadmaja Carolee Channell MD [facility] Office  [phone] Pager - Text Page per Loretha StaplerAmion  If 7PM-7AM, please contact night-coverage per Amion 11/29/2017, 5:30 PM

## 2017-11-29 NOTE — Evaluation (Signed)
Occupational Therapy Evaluation Patient Details Name: Taylor FlemingSara L Elsberry MRN: [id number] DOB: 06-28-1928 Today's Date: 11/29/2017    History of Present Illness 84 yo admitted with fall and symptomatic bradycardia to 30. PMhx: chronic T2 compression fx, microvascular strokes, anxiety, CHF, gout, HTN   Clinical Impression   PTA patient was independent with ADLs, limited cooking and using cane for mobility.  She currently requires min guard for seated UB ADLs, modA for LB ADLs, minA for transfers, and minA for bed mobility.  She demonstrates decreased activity tolerance, generalized weakness, impaired balance, and decreased safety awareness.  Oriented to person only, thinking she is already at the nursing home which her daughter reports she will be dcing to.  Recommend continued OT services while admitted and SNF after dc in order to maximize independence and safety prior to dc home.  Will continue to follow while admitted.     Follow Up Recommendations  SNF;Supervision/Assistance - 24 hour    Equipment Recommendations  Other (comment)(TBD at next venue of care)    Recommendations for Other Services       Precautions / Restrictions Precautions Precautions: Fall Restrictions Weight Bearing Restrictions: No      Mobility Bed Mobility Overal bed mobility: Needs Assistance Bed Mobility: Supine to Sit;Sit to Supine;Rolling Rolling: Min assist   Supine to sit: Min assist Sit to supine: Min assist   General bed mobility comments: assist to elevate trunk into sitting and guiding back to supine  Transfers Overall transfer level: Needs assistance Equipment used: 1 person hand held assist Transfers: Sit to/from Stand Sit to Stand: Min assist         General transfer comment: min assist to ascend into standing, min guard for safety     Balance Overall balance assessment: Needs assistance Sitting-balance support: No upper extremity supported;Feet supported Sitting balance-Leahy  Scale: Fair Sitting balance - Comments: intermittent posterior lean requriing min guard for safety   Standing balance support: Single extremity supported;During functional activity Standing balance-Leahy Scale: Poor Standing balance comment: reliant on UE and external support                           ADL either performed or assessed with clinical judgement   ADL Overall ADL's : Needs assistance/impaired Eating/Feeding: Set up;Sitting   Grooming: Min guard;Sitting Grooming Details (indicate cue type and reason): min guard sitting unsupported EOB after setup assist Upper Body Bathing: Min guard;Sitting   Lower Body Bathing: Sit to/from stand;Minimal assistance   Upper Body Dressing : Min guard;Sitting   Lower Body Dressing: Sit to/from stand;Moderate assistance   Toilet Transfer: Minimal assistance(simulated in room) Toilet Transfer Details (indicate cue type and reason): hand held assist, cueing for technique and sequencing  Toileting- Clothing Manipulation and Hygiene: Moderate assistance;Sit to/from stand       Functional mobility during ADLs: Minimal assistance(hand held assist ) General ADL Comments: completed ADLs, bed mobility and [facility]     Vision Baseline Vision/History: Wears glasses Wears Glasses: At all times Patient Visual Report: No change from baseline Vision Assessment?: No apparent visual deficits     Perception     Praxis      Pertinent Vitals/Pain Pain Assessment: No/denies pain     Hand Dominance     Extremity/Trunk Assessment Upper Extremity Assessment Upper Extremity Assessment: Generalized weakness   Lower Extremity Assessment Lower Extremity Assessment: Defer to PT evaluation       Communication Communication Communication: No  difficulties   Cognition Arousal/Alertness: Awake/alert Behavior During Therapy: WFL for tasks assessed/performed Overall Cognitive Status: Impaired/Different from baseline Area  of Impairment: Memory;Awareness;Safety/judgement;Orientation                 Orientation Level: Disoriented to;Place;Time;Situation Current Attention Level: Sustained Memory: Decreased short-term memory   Safety/Judgement: Decreased awareness of deficits;Decreased awareness of safety Awareness: Emergent   General Comments: Patient unable to state date, reports in "a nursing home".   Daughter reports plans to dc to SNF soon.    General Comments  daughter present during session     Exercises     Shoulder Instructions      Home Living Family/patient expects to be discharged to:: Private residence Living Arrangements: Alone Available Help at Discharge: Family;Available PRN/intermittently Type of Home: House Home Access: Stairs to enter [employer] CorporationEntrance Stairs-Number of Steps: 1   Home Layout: One level     Bathroom Shower/Tub: Chief Strategy OfficerTub/shower unit   Bathroom Toilet: Standard     Home Equipment: Bedside commode;Cane - single point;Walker - 2 wheels          Prior Functioning/Environment Level of Independence: Independent with assistive device(s)  Gait / Transfers Assistance Needed: used cane for mobility  ADL's / Homemaking Assistance Needed: completed basin baths, limited cooking, reports independent dressing/toileting              OT Problem List: Decreased strength;Decreased activity tolerance;Impaired balance (sitting and/or standing);Decreased cognition;Decreased safety awareness;Decreased knowledge of use of DME or AE;Decreased knowledge of precautions      OT Treatment/Interventions: Self-care/ADL training;Therapeutic exercise;Therapeutic activities;Patient/family education;Balance training;DME and/or AE instruction    OT Goals(Current goals can be found in the care plan section) Acute Rehab OT Goals Patient Stated Goal: to go to rehab  OT Goal Formulation: With patient/family Time For Goal Achievement: 12/13/17 Potential to Achieve Goals: Good  OT Frequency:  Min 2X/week   Barriers to D/C:            Co-evaluation              AM-PAC PT "6 Clicks" Daily Activity     Outcome Measure Help from another person eating meals?: None Help from another person taking care of personal grooming?: A Little Help from another person toileting, which includes using toliet, bedpan, or urinal?: A Lot Help from another person bathing (including washing, rinsing, drying)?: A Little Help from another person to put on and taking off regular upper body clothing?: A Little Help from another person to put on and taking off regular lower body clothing?: A Lot 6 Click Score: 17   End of Session Nurse Communication: Mobility status  Activity Tolerance: Patient tolerated treatment well;Patient limited by fatigue Patient left: in bed;with bed alarm set;with nursing/sitter in room  OT Visit Diagnosis: Unsteadiness on feet (R26.81);Muscle weakness (generalized) (M62.81)                Time: [id number] OT Time Calculation (min): 18 min Charges:  OT General Charges $OT Visit: 1 Visit OT Evaluation $OT Eval Moderate Complexity: 1 [address]Mod  Kalub Morillo S Aqib Lough, OTR/L  Pager [phone]   Chancy MilroyChristie S Ermagene Saidi 11/29/2017, 9:58 AM

## 2017-11-29 NOTE — Clinical Social Work Note (Addendum)
CSW left voicemail for SNF admissions coordinator to check status of authorization.  Taylor CourtSarah Tiger Coleman, CSW [phone]  11:38 am Authorization still pending. Admissions coordinator is checking for updates every 30 minutes.  Taylor CourtSarah Skyelynn Coleman, CSW [phone]  1:52 pm Authorization still pending. Admissions coordinator has left voicemails for [facility].  Taylor CourtSarah Tylene Coleman, CSW [phone]

## 2017-11-29 NOTE — Progress Notes (Signed)
Physical Therapy Treatment Patient Details Name: Taylor FlemingSara L Coleman MRN: [id number] DOB: 05-20-1928 Today's Date: Coleman    History of Present Illness 84 yo admitted with fall and symptomatic bradycardia to 30. PMhx: chronic T2 compression fx, microvascular strokes, anxiety, CHF, gout, HTN    PT Comments    Pt more sleepy/lethargic this AM but did arouse to ambulate. Continue to recommend ST-SNF.   Follow Up Recommendations  SNF;Supervision/Assistance - 24 hour     Equipment Recommendations  None recommended by PT    Recommendations for Other Services       Precautions / Restrictions Precautions Precautions: Fall Restrictions Weight Bearing Restrictions: No    Mobility  Bed Mobility Overal bed mobility: Needs Assistance Bed Mobility: Supine to Sit;Sit to Supine     Supine to sit: Min assist Sit to supine: Min guard   General bed mobility comments: Assist to elevate trunk into sitting  Transfers Overall transfer level: Needs assistance Equipment used: 4-wheeled walker Transfers: Sit to/from Stand Sit to Stand: Min assist         General transfer comment: Assist to bring hips up and for balance  Ambulation/Gait Ambulation/Gait assistance: Min assist Gait Distance (Feet): 170 Feet Assistive device: 4-wheeled walker Gait Pattern/deviations: Step-through pattern;Decreased stride length;Trunk flexed Gait velocity: decr Gait velocity interpretation: <1.8 ft/sec, indicate of risk for recurrent falls General Gait Details: Assist for balance and support. Verbal cues to stand more erect.    Stairs             Wheelchair Mobility    Modified Rankin (Stroke Patients Only)       Balance Overall balance assessment: Needs assistance Sitting-balance support: No upper extremity supported;Feet supported Sitting balance-Leahy Scale: Fair     Standing balance support: Single extremity supported Standing balance-Leahy Scale: Poor Standing balance comment:  UE support                            Cognition Arousal/Alertness: Lethargic Behavior During Therapy: WFL for tasks assessed/performed Overall Cognitive Status: Impaired/Different from baseline Area of Impairment: Memory;Safety/judgement;Orientation                 Orientation Level: Disoriented to;Place;Time;Situation   Memory: Decreased short-term memory;Decreased recall of precautions Following Commands: Follows one step commands consistently;Follows one step commands with increased time Safety/Judgement: Decreased awareness of deficits;Decreased awareness of safety            Exercises      General Comments        Pertinent Vitals/Pain Pain Assessment: No/denies pain    Home Living                      Prior Function            PT Goals (current goals can now be found in the care plan section) Progress towards PT goals: Progressing toward goals    Frequency    Min 2X/week      PT Plan Current plan remains appropriate    Co-evaluation              AM-PAC PT "6 Clicks" Daily Activity  Outcome Measure  Difficulty turning over in bed (including adjusting bedclothes, sheets and blankets)?: A Little Difficulty moving from lying on back to sitting on the side of the bed? : Unable Difficulty sitting down on and standing up from a chair with arms (e.g., wheelchair, bedside commode, etc,.)?: Unable Help needed moving to  and from a bed to chair (including a wheelchair)?: A Little Help needed walking in hospital room?: A Little Help needed climbing 3-5 steps with a railing? : A Little 6 Click Score: 14    End of Session Equipment Utilized During Treatment: Gait belt Activity Tolerance: Patient limited by lethargy Patient left: with call bell/phone within reach;with family/visitor present;in bed;with bed alarm set Nurse Communication: Mobility status PT Visit Diagnosis: Other abnormalities of gait and mobility (R26.89);Muscle  weakness (generalized) (M62.81);Unsteadiness on feet (R26.81)     Time: 1100-1117 PT Time Calculation (min) (ACUTE ONLY): 17 min  Charges:  $Gait Training: 8-22 mins                     [facility] Swayzie Coleman PT [phone]    Taylor Coleman [facility] Coleman, 2:18 PM

## 2017-12-18 NOTE — Progress Notes (Signed)
Pt had 18 beat run of VTach-  RN checked on pt- pt not in distress BP- 173/62  HR 75 RN will continue to monitor

## 2017-12-18 NOTE — Progress Notes (Signed)
Pt went into Ventricular fibrillation and became unresponsive, then expired. Pronounced by 2 RNs. TOD 0435. Pt was a DNR. Death certificate completed and given to RN.  KJKG, NP [facility]

## 2017-12-18 NOTE — Consult Note (Signed)
            [facility] CM Primary Care Navigator  04-18-2018  Odessa FlemingSara L Hollenback Nov 23, 1928 [id number]   Attempt to see patientat the bedsideto identify possible discharge needs but staffreportsthat patient expired.(Time of death was 4:35 am)   For additional questions please contact:  Karin GoldenLorraine A. Todd Jelinski, BSN, RN-BC [facility] PRIMARY CARE Navigator Cell: [phone]

## 2017-12-18 NOTE — Death Summary Note (Signed)
  Death Summary  Taylor Coleman [id number] DOB: May 25, 1928 DOA: 11/18/2017  PCP: Patient, No Pcp Per  Admit date: 12/14/2017 Date of Death: 04-Oct-2017 Time of Death: 4:33 AM Notification: Patient, No Pcp Per notified of death of 04-Oct-2017   History of present illness:  84 y/o F admitted 8/7 from [facility] after suffering a fall, with an initial work up notable for confusion and bradycardia. Her BB dose had recently been increased as an outpt. 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 Home medications were held (including metoprolol which had recently been increased). Despite being off medications and dopamine infusion, she remained bradycardic. On 8/7 pm she had an episode of VT requiring defibrillation.  Cardiology was consulted.  Patient was treated medically.  Patient remained hemodynamically stable.  However patient went into ventricular fibrillation.  As patient was a DO NOT RESUSCITATE, no aggressive measures were taken.  Patient was declared dead on 04-Oct-2017 at 4:53 AM   ##Sick Sinus Syndrome with Syncopal Episode  no PPM per family - avoid BB - cont PT/OT - HR stable at this time   ##Unstable VT episode of pulsatile VT but w/ altered LOC 8/7 pm s/p defibrillation  ##Chronic Atrial fib  Bradycardia is the issue at this time   ##AKI on CKD IV  crt rapidly improving - recent baseline not clear   ## Debility - PT rec SNIF - placement pending  ## Constipation - resolved with laxatives and stool softners  ## Encephalopathy metabolic - also concern about underlying dementia  ##Dysphagea - ST rec dysphagea diet  ## thrombocytopenia - stable  Final Diagnoses:  1.  Ventricular tachycardia       SIGNED:  Susa GriffinsVASIREDDY,Jacklin Zwick, MD  [facility] 04-Oct-2017, 4:12 PM Pager   If 7PM-7AM, please contact night-coverage [email or web address] Password [email or web address]

## 2017-12-18 DEATH — deceased

## 2019-04-21 IMAGING — DX DG CHEST 1V PORT
1 series · 1 of 1 positions shown · non-contrast
Comparison: Chest radiograph November 22, 2017

CLINICAL DATA: Bradycardia.

EXAM:
PORTABLE CHEST 1 VIEW

[chest ap]
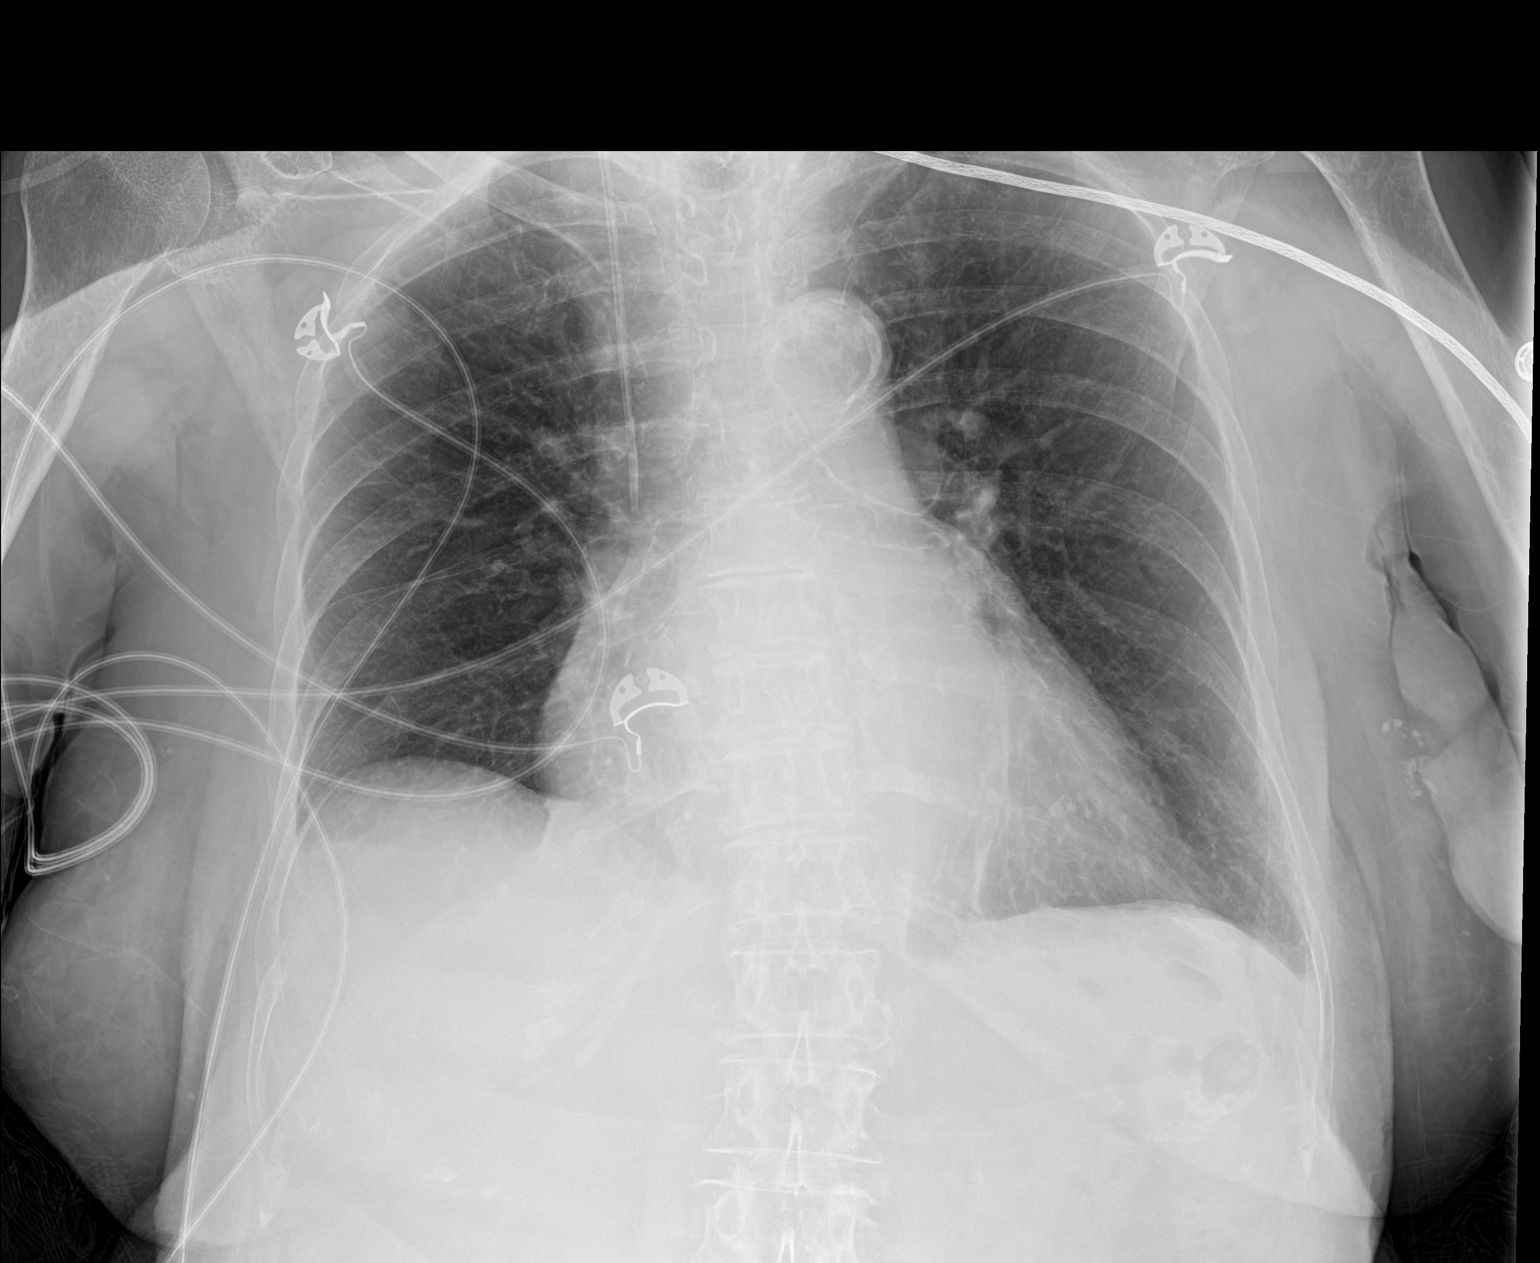

[1 of 1 positions shown; findings below may reference images not displayed]

FINDINGS: Stable cardiomegaly. Calcified aortic arch. Mild chronic
interstitial changes without pleural effusion or focal
consolidation. Persistently elevated RIGHT hemidiaphragm. RIGHT
internal jugular central venous catheter distal tip projects in mid
superior vena cava. No pneumothorax. Osteopenia. Coarse
calcifications LEFT breast.
IMPRESSION: Stable cardiomegaly. No acute pulmonary process. Stable appearance
of RIGHT IJ line.

Aortic Atherosclerosis ([id number]).
